# Patient Record
Sex: Male | Born: 1990 | Race: Black or African American | Hispanic: No | Marital: Single | Smoking: Current some day smoker
Health system: Southern US, Community
[De-identification: ages and names within clinical notes are randomized; demographics above are authoritative.]

## PROBLEM LIST (undated history)

## (undated) DIAGNOSIS — D571 Sickle-cell disease without crisis: Secondary | ICD-10-CM

---

## 2009-10-06 ENCOUNTER — Emergency Department (HOSPITAL_COMMUNITY): Admission: EM | Admit: 2009-10-06 | Discharge: 2009-10-06 | Payer: Self-pay | Admitting: Emergency Medicine

## 2016-08-09 ENCOUNTER — Emergency Department (HOSPITAL_COMMUNITY): Payer: Self-pay

## 2016-08-09 ENCOUNTER — Encounter (HOSPITAL_COMMUNITY): Payer: Self-pay

## 2016-08-09 ENCOUNTER — Emergency Department (HOSPITAL_COMMUNITY)
Admission: EM | Admit: 2016-08-09 | Discharge: 2016-08-09 | Disposition: A | Discharge status: Home / Self Care | Payer: Self-pay | Attending: Emergency Medicine | Admitting: Emergency Medicine

## 2016-08-09 DIAGNOSIS — Z7982 Long term (current) use of aspirin: Secondary | ICD-10-CM | POA: Insufficient documentation

## 2016-08-09 DIAGNOSIS — D5700 Hb-SS disease with crisis, unspecified: Secondary | ICD-10-CM

## 2016-08-09 DIAGNOSIS — R079 Chest pain, unspecified: Secondary | ICD-10-CM

## 2016-08-09 DIAGNOSIS — F17200 Nicotine dependence, unspecified, uncomplicated: Secondary | ICD-10-CM | POA: Insufficient documentation

## 2016-08-09 DIAGNOSIS — R091 Pleurisy: Secondary | ICD-10-CM

## 2016-08-09 HISTORY — DX: Sickle-cell disease without crisis: D57.1

## 2016-08-09 LAB — BASIC METABOLIC PANEL
ANION GAP: 6 (ref 5–15)
BUN: 12 mg/dL (ref 6–20)
CALCIUM: 9.3 mg/dL (ref 8.9–10.3)
CO2: 26 mmol/L (ref 22–32)
Chloride: 109 mmol/L (ref 101–111)
Creatinine, Ser: 1.04 mg/dL (ref 0.61–1.24)
Glucose, Bld: 93 mg/dL (ref 65–99)
Potassium: 3.8 mmol/L (ref 3.5–5.1)
Sodium: 141 mmol/L (ref 135–145)

## 2016-08-09 LAB — CBC
HCT: 35.5 % — ABNORMAL LOW (ref 39.0–52.0)
HEMOGLOBIN: 12.9 g/dL — AB (ref 13.0–17.0)
MCH: 25.1 pg — ABNORMAL LOW (ref 26.0–34.0)
MCHC: 36.3 g/dL — ABNORMAL HIGH (ref 30.0–36.0)
MCV: 69.2 fL — ABNORMAL LOW (ref 78.0–100.0)
Platelets: 74 10*3/uL — ABNORMAL LOW (ref 150–400)
RBC: 5.13 MIL/uL (ref 4.22–5.81)
RDW: 16.7 % — ABNORMAL HIGH (ref 11.5–15.5)
WBC: 7.8 10*3/uL (ref 4.0–10.5)

## 2016-08-09 LAB — I-STAT TROPONIN, ED: TROPONIN I, POC: 0 ng/mL (ref 0.00–0.08)

## 2016-08-09 LAB — SEDIMENTATION RATE: Sed Rate: 1 mm/hr (ref 0–16)

## 2016-08-09 LAB — RETICULOCYTES
RBC.: 4.82 MIL/uL (ref 4.22–5.81)
RETIC CT PCT: 1.7 % (ref 0.4–3.1)
Retic Count, Absolute: 81.9 10*3/uL (ref 19.0–186.0)

## 2016-08-09 LAB — D-DIMER, QUANTITATIVE: D-Dimer, Quant: 0.55 ug/mL-FEU — ABNORMAL HIGH (ref 0.00–0.50)

## 2016-08-09 MED ORDER — IOPAMIDOL (ISOVUE-370) INJECTION 76%
100.0000 mL | Freq: Once | INTRAVENOUS | Status: AC | PRN
  Administered 2016-08-09: 100 mL via INTRAVENOUS

## 2016-08-09 MED ORDER — MORPHINE SULFATE (PF) 4 MG/ML IV SOLN
4.0000 mg | Freq: Once | INTRAVENOUS | Status: AC
  Administered 2016-08-09: 4 mg via INTRAVENOUS
  Filled 2016-08-09: qty 1

## 2016-08-09 MED ORDER — KETOROLAC TROMETHAMINE 30 MG/ML IJ SOLN
30.0000 mg | Freq: Once | INTRAMUSCULAR | Status: AC
  Administered 2016-08-09: 30 mg via INTRAVENOUS
  Filled 2016-08-09: qty 1

## 2016-08-09 MED ORDER — HYDROCODONE-ACETAMINOPHEN 5-325 MG PO TABS: 2.0000 | ORAL_TABLET | ORAL | 0 refills | Status: DC | PRN

## 2016-08-09 MED ORDER — ONDANSETRON HCL 4 MG/2ML IJ SOLN
4.0000 mg | Freq: Once | INTRAMUSCULAR | Status: AC
  Administered 2016-08-09: 4 mg via INTRAVENOUS
  Filled 2016-08-09: qty 2

## 2016-08-09 MED ORDER — INDOMETHACIN 25 MG PO CAPS: 25.0000 mg | ORAL_CAPSULE | Freq: Three times a day (TID) | ORAL | 0 refills | Status: DC | PRN

## 2016-08-09 NOTE — ED Triage Notes (Signed)
Pt has sickle cell.  Normal pain is in extremities.  Last night and this morning he has been having chest pain.  Denies physical exertion or cough/congestion

## 2016-08-09 NOTE — ED Provider Notes (Signed)
[facility]ER[facility] DEPT Provider Note   CSN: [id number] Arrival date & time: 08/09/16  8657     History   Chief Complaint Chief Complaint  Patient presents with  . Chest Pain    HPI Justin Solis is a 25 y.o. male. Reason for evaluation of leg pain, and chest pain.  Has a history of sickle cell anemia. He is normally under excellent control. He states his last flare was many years ago. He does not take daily medications for please never had chest pain is a symptom of his nasal occlusive crisis but he has had extremity pain. He at some bilateral arm and bilateral leg pain over the last 2 days. He states that he has some pain in his chest throughout the day today. He rates it an 8/10 and it is sharp it is painful to move and painful to breathe. He has not had fever cough or shortness of breath. No history of DVT or PE. No history of ACS.  HPI  Past Medical History:  Diagnosis Date  . Sickle cell anemia (HCC)     There are no active problems to display for this patient.   History reviewed. No pertinent surgical history.     Home Medications    Prior to Admission medications   Medication Sig Start Date End Date Taking? Authorizing Provider  aspirin-acetaminophen-caffeine (EXCEDRIN MIGRAINE) [phone] MG tablet Take 1 tablet by mouth every 6 (six) hours as needed for headache.   Yes Historical Provider, MD  HYDROcodone-acetaminophen (NORCO/VICODIN) 5-325 MG tablet Take 2 tablets by mouth every 4 (four) hours as needed. 08/09/16   Rolland Porter, MD  indomethacin (INDOCIN) 25 MG capsule Take 1 capsule (25 mg total) by mouth 3 (three) times daily as needed. 08/09/16   Rolland Porter, MD    Family History History reviewed. No pertinent family history.  Social History Social History  Substance Use Topics  . Smoking status: Current Some Day Smoker  . Smokeless tobacco: Never Used  . Alcohol use No     Allergies   Review of patient's allergies indicates no known  allergies.   Review of Systems Review of Systems  Constitutional: Negative for appetite change, chills, diaphoresis, fatigue and fever.  HENT: Negative for mouth sores, sore throat and trouble swallowing.   Eyes: Negative for visual disturbance.  Respiratory: Negative for cough, chest tightness, shortness of breath and wheezing.   Cardiovascular: Positive for chest pain.  Gastrointestinal: Negative for abdominal distention, abdominal pain, diarrhea, nausea and vomiting.  Endocrine: Negative for polydipsia, polyphagia and polyuria.  Genitourinary: Negative for dysuria, frequency and hematuria.  Musculoskeletal: Positive for myalgias. Negative for gait problem.  Skin: Negative for color change, pallor and rash.  Neurological: Negative for dizziness, syncope, light-headedness and headaches.  Hematological: Does not bruise/bleed easily.  Psychiatric/Behavioral: Negative for behavioral problems and confusion.     Physical Exam Updated Vital Signs BP 117/76 (BP Location: Left Arm)   Pulse 66   Temp 97.8 F (36.6 C) (Oral)   Resp 15   Ht 6\' 2"  (1.88 m)   Wt 170 lb (77.1 kg)   SpO2 98%   BMI 21.83 kg/m   Physical Exam  Constitutional: He is oriented to person, place, and time. He appears well-developed and well-nourished. No distress.  HENT:  Head: Normocephalic.  Eyes: Conjunctivae are normal. Pupils are equal, round, and reactive to light. No scleral icterus.  Neck: Normal range of motion. Neck supple. No thyromegaly present.  Cardiovascular: Normal rate and regular rhythm.  Exam reveals no gallop and no friction rub.   No murmur heard. Is not tachycardic. No pleural or pericardial friction rub.  Pulmonary/Chest: Effort normal and breath sounds normal. No respiratory distress. He has no wheezes. He has no rales.  Holds his hand across his sternum and just to the left of sternum. Nontender to palpate. Clear bilateral breath sounds.  Abdominal: Soft. Bowel sounds are normal. He  exhibits no distension. There is no tenderness. There is no rebound.  Musculoskeletal: Normal range of motion.  Complains of pain in bilateral thighs. Normal examination. No edema or asymmetry in circumference of the lower extremities. No cording.  Neurological: He is alert and oriented to person, place, and time.  Skin: Skin is warm and dry. No rash noted.  Psychiatric: He has a normal mood and affect. His behavior is normal.     ED Treatments / Results  Labs (all labs ordered are listed, but only abnormal results are displayed) Labs Reviewed  CBC - Abnormal; Notable for the following:       Result Value   Hemoglobin 12.9 (*)    HCT 35.5 (*)    MCV 69.2 (*)    MCH 25.1 (*)    MCHC 36.3 (*)    RDW 16.7 (*)    Platelets 74 (*)    All other components within normal limits  D-DIMER, QUANTITATIVE (NOT [facility]) - Abnormal; Notable for the following:    D-Dimer, Quant 0.55 (*)    All other components within normal limits  BASIC METABOLIC PANEL  SEDIMENTATION RATE  RETICULOCYTES  I-STAT TROPOININ, ED    EKG  EKG Interpretation  Date/Time:  Tuesday August 09 2016 08:50:28 EDT Ventricular Rate:  73 PR Interval:    QRS Duration: 98 QT Interval:  354 QTC Calculation: 390 R Axis:   88 Text Interpretation:  Sinus rhythm Borderline prolonged PR interval RSR' in V1 or V2, right VCD or RVH ST changes, ?pericarditis No comparison EKGs Reconfirmed by [facility]  MD, Yaileen Hofferber ([id number]) on 08/09/2016 9:58:05 AM       Radiology Dg Chest 2 View  Result Date: 08/09/2016 CLINICAL DATA:  Chest pain and shortness of breath. Sickle cell crisis. EXAM: CHEST  2 VIEW COMPARISON:  None. FINDINGS: The lungs are clear. The heart size and pulmonary vascularity are normal. No adenopathy. No bone lesions evident. IMPRESSION: No abnormality noted radiographically. Electronically Signed   By: Bretta BangWilliam  Woodruff III M.D.   On: 08/09/2016 09:19   Ct Angio Chest Pe W Or Wo Contrast  Result Date:  08/09/2016 CLINICAL DATA:  25 year old with sickle cell disease presenting with acute onset of chest pain which began last night. Elevated D-dimer. EXAM: CT ANGIOGRAPHY CHEST WITH CONTRAST TECHNIQUE: Multidetector CT imaging of the chest was performed using the standard protocol during bolus administration of intravenous contrast. Multiplanar CT image reconstructions and MIPs were obtained to evaluate the vascular anatomy. CONTRAST:  100 mL Isovue 370 IV. COMPARISON:  None. FINDINGS: Technical quality:  Excellent. Pulmonary emboli:  Absent. Cardiovascular: Upper normal heart size. No visible coronary atherosclerosis. No visible atherosclerosis involving thoracic or upper abdominal aorta or their visualized branches. No pericardial effusion. Mediastinum/Nodes: No pathologically enlarged mediastinal, hilar or axillary lymph nodes. No mediastinal masses. Normal-appearing esophagus. Residual thymic tissue in the anterior superior mediastinum. Visualized thyroid gland unremarkable. Lungs/Pleura: Pulmonary parenchyma clear without localized airspace consolidation, interstitial disease, or parenchymal nodules or masses. Central airways patent without significant bronchial wall thickening. No pleural effusions. No pleural plaques or masses. Upper  Abdomen: Unremarkable for the very early arterial phase of enhancement. Musculoskeletal: Regional skeleton intact without acute or significant osseous abnormality. Review of the MIP images confirms the above findings. IMPRESSION: 1. No evidence of pulmonary embolism. 2.  No acute cardiopulmonary disease. Electronically Signed   By: Hulan Saas M.D.   On: 08/09/2016 12:44    Procedures Procedures (including critical care time)  Medications Ordered in ED Medications  ketorolac (TORADOL) 30 MG/ML injection 30 mg (30 mg Intravenous Given 08/09/16 1031)  iopamidol (ISOVUE-370) 76 % injection 100 mL (100 mLs Intravenous Contrast Given 08/09/16 1225)  ondansetron (ZOFRAN)  injection 4 mg (4 mg Intravenous Given 08/09/16 1301)  morphine 4 MG/ML injection 4 mg (4 mg Intravenous Given 08/09/16 1301)     Initial Impression / Assessment and Plan / ED Course  I have reviewed the triage vital signs and the nursing notes.  Pertinent labs & imaging results that were available during my care of the patient were reviewed by me and considered in my medical decision making (see chart for details).  Clinical Course    EKG shows early repolarization. No frank elevations or PR depression to suggest pericarditis. He is not tachycardic. However, was considering his leg pain and pleuritic chest pain d-dimer was ordered which is high. CT and shows normal chest. No fluid around the heart to suggest pericarditis. No pleural effusion. No infiltrates. No PE. He was given Toradol his symptoms in his chest are markedly improved. His given some IV morphine and the pain in his legs has improved. I think is appropriate for discharge home remainder of his labs are reassuring. He will follow-up with primary care. Given prescription for indomethacin for his pleurisy. Vicodin for his sickle cell flare.  Final Clinical Impressions(s) / ED Diagnoses   Final diagnoses:  Chest pain, unspecified chest pain type  Pleurisy  Sickle cell anemia with crisis [facility])    New Prescriptions Discharge Medication List as of 08/09/2016  1:35 PM    START taking these medications   Details  HYDROcodone-acetaminophen (NORCO/VICODIN) 5-325 MG tablet Take 2 tablets by mouth every 4 (four) hours as needed., Starting Tue 08/09/2016, Print    indomethacin (INDOCIN) 25 MG capsule Take 1 capsule (25 mg total) by mouth 3 (three) times daily as needed., Starting Tue 08/09/2016, Print         Rolland Porter, MD 08/09/16 [phone]

## 2016-08-09 NOTE — Discharge Instructions (Signed)
[place] for leg pain.  Avoid vigorous physical activity and exercise until your chest pain has resolved.

## 2016-08-24 DIAGNOSIS — D5720 Sickle-cell/Hb-C disease without crisis: Secondary | ICD-10-CM | POA: Insufficient documentation

## 2016-08-24 DIAGNOSIS — F17200 Nicotine dependence, unspecified, uncomplicated: Secondary | ICD-10-CM | POA: Insufficient documentation

## 2016-08-25 ENCOUNTER — Encounter (HOSPITAL_COMMUNITY): Payer: Self-pay | Admitting: Emergency Medicine

## 2016-08-25 ENCOUNTER — Emergency Department (HOSPITAL_COMMUNITY)
Admission: EM | Admit: 2016-08-25 | Discharge: 2016-08-25 | Disposition: A | Discharge status: Home / Self Care | Payer: Self-pay | Attending: Emergency Medicine | Admitting: Emergency Medicine

## 2016-08-25 DIAGNOSIS — D57219 Sickle-cell/Hb-C disease with crisis, unspecified: Secondary | ICD-10-CM

## 2016-08-25 LAB — COMPREHENSIVE METABOLIC PANEL
ALBUMIN: 4.9 g/dL (ref 3.5–5.0)
ALK PHOS: 53 U/L (ref 38–126)
ALT: 31 U/L (ref 17–63)
ANION GAP: 7 (ref 5–15)
AST: 23 U/L (ref 15–41)
BUN: 15 mg/dL (ref 6–20)
CALCIUM: 9.6 mg/dL (ref 8.9–10.3)
CO2: 25 mmol/L (ref 22–32)
Chloride: 108 mmol/L (ref 101–111)
Creatinine, Ser: 1.02 mg/dL (ref 0.61–1.24)
GFR calc non Af Amer: >60 mL/min (ref >60)
GLUCOSE: 124 mg/dL — AB (ref 65–99)
POTASSIUM: 3.7 mmol/L (ref 3.5–5.1)
SODIUM: 140 mmol/L (ref 135–145)
Total Bilirubin: 1 mg/dL (ref 0.3–1.2)
Total Protein: 7.7 g/dL (ref 6.5–8.1)

## 2016-08-25 LAB — CBC WITH DIFFERENTIAL/PLATELET
BASOS ABS: 0 10*3/uL (ref 0.0–0.1)
BASOS PCT: 0 %
EOS ABS: 0.1 10*3/uL (ref 0.0–0.7)
Eosinophils Relative: 1 %
HCT: 34 % — ABNORMAL LOW (ref 39.0–52.0)
HEMOGLOBIN: 12.4 g/dL — AB (ref 13.0–17.0)
LYMPHS PCT: 21 %
Lymphs Abs: 1.7 10*3/uL (ref 0.7–4.0)
MCH: 25 pg — ABNORMAL LOW (ref 26.0–34.0)
MCHC: 36.5 g/dL — ABNORMAL HIGH (ref 30.0–36.0)
MCV: 68.5 fL — ABNORMAL LOW (ref 78.0–100.0)
MONOS PCT: 6 %
Monocytes Absolute: 0.5 10*3/uL (ref 0.1–1.0)
NEUTROS ABS: 5.6 10*3/uL (ref 1.7–7.7)
NEUTROS PCT: 72 %
PLATELETS: 83 10*3/uL — AB (ref 150–400)
RBC: 4.96 MIL/uL (ref 4.22–5.81)
RDW: 16.9 % — ABNORMAL HIGH (ref 11.5–15.5)
WBC: 7.9 10*3/uL (ref 4.0–10.5)

## 2016-08-25 LAB — RETICULOCYTES
RBC.: 5.01 MIL/uL (ref 4.22–5.81)
Retic Count, Absolute: 120.2 10*3/uL (ref 19.0–186.0)
Retic Ct Pct: 2.4 % (ref 0.4–3.1)

## 2016-08-25 MED ORDER — HYDROMORPHONE HCL 1 MG/ML IJ SOLN: 1.0000 mg | Freq: Once | INTRAMUSCULAR | Status: DC

## 2016-08-25 MED ORDER — MORPHINE SULFATE (PF) 4 MG/ML IV SOLN
4.0000 mg | Freq: Once | INTRAVENOUS | Status: AC
  Administered 2016-08-25: 4 mg via INTRAVENOUS
  Filled 2016-08-25: qty 1

## 2016-08-25 MED ORDER — SODIUM CHLORIDE 0.9 % IV BOLUS (SEPSIS)
1000.0000 mL | Freq: Once | INTRAVENOUS | Status: AC
  Administered 2016-08-25: 1000 mL via INTRAVENOUS

## 2016-08-25 MED ORDER — INDOMETHACIN 25 MG PO CAPS: 25.0000 mg | ORAL_CAPSULE | Freq: Three times a day (TID) | ORAL | 0 refills | Status: DC | PRN

## 2016-08-25 MED ORDER — KETOROLAC TROMETHAMINE 30 MG/ML IJ SOLN
30.0000 mg | Freq: Once | INTRAMUSCULAR | Status: AC
  Administered 2016-08-25: 30 mg via INTRAVENOUS
  Filled 2016-08-25: qty 1

## 2016-08-25 MED ORDER — ONDANSETRON HCL 4 MG/2ML IJ SOLN
4.0000 mg | Freq: Once | INTRAMUSCULAR | Status: AC
  Administered 2016-08-25: 4 mg via INTRAVENOUS
  Filled 2016-08-25: qty 2

## 2016-08-25 NOTE — ED Provider Notes (Signed)
[facility] DEPT Provider Note   CSN: [id number] Arrival date & time: 08/24/16  2354 By signing my name below, I, Justin Solis, attest that this documentation has been prepared under the direction and in the presence of non-physician practitioner, Antony MaduraKelly Chrystie Hagwood, PA-C Electronically Signed: Levon HedgerElizabeth Solis, Scribe. 08/25/2016. 1:35 AM.    History   Chief Complaint Chief Complaint  Patient presents with  . Sickle Cell Pain Crisis   HPI Justin Solis is a 25 y.o. male with hx of sickle cell anemia ([place] trait) who presents to the Emergency Department complaining of constant, severe left arm pain which began at 6 this am. He describes the pain as aching, sharp, and stabbing. He has taken Excedrin with no relief. He had not had a crisis in seven years until about 2 weeks ago. Pt notes associated vomiting 2x PTA. Pt denies fever, SOB, and CP.    The history is provided by the patient. No language interpreter was used.    Past Medical History:  Diagnosis Date  . Sickle cell anemia (HCC)    There are no active problems to display for this patient.  History reviewed. No pertinent surgical history.   Home Medications    Prior to Admission medications   Medication Sig Start Date End Date Taking? Authorizing Provider  aspirin-acetaminophen-caffeine (EXCEDRIN MIGRAINE) [phone] MG tablet Take 1 tablet by mouth every 6 (six) hours as needed for headache.   Yes Historical Provider, MD  HYDROcodone-acetaminophen (NORCO/VICODIN) 5-325 MG tablet Take 2 tablets by mouth every 4 (four) hours as needed. Patient not taking: Reported on 08/25/2016 08/09/16   Rolland PorterMark James, MD  indomethacin (INDOCIN) 25 MG capsule Take 1 capsule (25 mg total) by mouth 3 (three) times daily as needed. 08/25/16   Antony MaduraKelly Kamarie Palma, PA-C    Family History History reviewed. No pertinent family history.  Social History Social History  Substance Use Topics  . Smoking status: Current Some Day Smoker  . Smokeless  tobacco: Never Used  . Alcohol use No     Allergies   Review of patient's allergies indicates no known allergies.   Review of Systems Review of Systems Ten systems reviewed and are negative for acute change, except as noted in the HPI.    Physical Exam Updated Vital Signs BP 125/85 (BP Location: Left Arm)   Pulse 77   Temp 98.2 F (36.8 C) (Oral)   Resp 18   Ht 6\' 2"  (1.88 m)   Wt 77.1 kg   SpO2 100%   BMI 21.83 kg/m   Physical Exam  Constitutional: He is oriented to person, place, and time. He appears well-developed and well-nourished. No distress.  Nontoxic appearing in no distress  HENT:  Head: Normocephalic and atraumatic.  Eyes: Conjunctivae and EOM are normal. No scleral icterus.  Neck: Normal range of motion.  Cardiovascular: Normal rate, regular rhythm and intact distal pulses.   Distal radial pulse 2+ in the left upper extremity.  Pulmonary/Chest: Effort normal. No respiratory distress. He has no wheezes.  Respirations even and unlabored  Musculoskeletal: Normal range of motion.  Normal range of motion of the left upper extremity. Compartments soft. No swelling or pitting edema.  Neurological: He is alert and oriented to person, place, and time. He exhibits normal muscle tone. Coordination normal.  Skin: Skin is warm and dry. No rash noted. He is not diaphoretic. No erythema. No pallor.  Psychiatric: He has a normal mood and affect. His behavior is normal.  Nursing note and vitals reviewed.  ED Treatments / Results  DIAGNOSTIC STUDIES:  Oxygen Saturation is 98% on RA, normal by my interpretation.    COORDINATION OF CARE:  1:30 AM  Will order reticulocytes, CBC, and CMP. Discussed treatment plan which includes dilaudid, Toradol and Zofran  with pt at bedside and pt agreed to plan.    Labs (all labs ordered are listed, but only abnormal results are displayed) Labs Reviewed  COMPREHENSIVE METABOLIC PANEL - Abnormal; Notable for the following:        Result Value   Glucose, Bld 124 (*)    All other components within normal limits  CBC WITH DIFFERENTIAL/PLATELET - Abnormal; Notable for the following:    Hemoglobin 12.4 (*)    HCT 34.0 (*)    MCV 68.5 (*)    MCH 25.0 (*)    MCHC 36.5 (*)    RDW 16.9 (*)    Platelets 83 (*)    All other components within normal limits  RETICULOCYTES    EKG  EKG Interpretation None       Radiology No results found.  Procedures Procedures (including critical care time)  Medications Ordered in ED Medications  morphine 4 MG/ML injection 4 mg (not administered)  ketorolac (TORADOL) 30 MG/ML injection 30 mg (30 mg Intravenous Given 08/25/16 0152)  sodium chloride 0.9 % bolus 1,000 mL (0 mLs Intravenous Stopped 08/25/16 0247)  ondansetron (ZOFRAN) injection 4 mg (4 mg Intravenous Given 08/25/16 0152)  morphine 4 MG/ML injection 4 mg (4 mg Intravenous Given 08/25/16 0152)  morphine 4 MG/ML injection 4 mg (4 mg Intravenous Given 08/25/16 0300)     Initial Impression / Assessment and Plan / ED Course  I have reviewed the triage vital signs and the nursing notes.  Pertinent labs & imaging results that were available during my care of the patient were reviewed by me and considered in my medical decision making (see chart for details).  Clinical Course    25 year old male with a history of sickle cell  anemia presents to the emergency department for pain in his left upper extremity. He is neurovascularly intact and has preserved range of motion. No complaints of chest pain or shortness of breath. Patient is afebrile and denies recent fevers. No history of trauma or injury. He states that his pain feels similar to past sickle cell pain flares. He was evaluated at the end of September for similar symptoms. He reports recently relocating to the area.  Symptoms managed with Toradol and morphine. Patient tolerated this well and reports improvement in his symptoms on repeat evaluation. Laboratory  workup is at baseline. Vital signs are stable. I did not believe further inpatient management is indicated for pain control. Patient to be discharged with instructions for supportive care. Financial assistance guide given for primary care follow-up. Return precautions discussed and provided. Patient discharged in stable condition with no unaddressed concerns.   Final Clinical Impressions(s) / ED Diagnoses   Final diagnoses:  Hemoglobin s/c disease, with unspecified crisis [facility])    New Prescriptions Current Discharge Medication List      I personally performed the services described in this documentation, which was scribed in my presence. The recorded information has been reviewed and is accurate.      Antony Madura, PA-C 08/25/16 0423    Shon Baton, MD 08/25/16 [phone]

## 2016-08-25 NOTE — ED Triage Notes (Addendum)
Pt states that he has had sickle cell pain in his arms since this morning. Does not have RX for pain meds at home. Also states that he vomited before arrival. Alert and oriented. Denies SOB/CP.

## 2016-08-26 ENCOUNTER — Encounter (HOSPITAL_COMMUNITY): Payer: Self-pay | Admitting: Emergency Medicine

## 2016-08-26 ENCOUNTER — Emergency Department (HOSPITAL_COMMUNITY)
Admission: EM | Admit: 2016-08-26 | Discharge: 2016-08-26 | Disposition: A | Discharge status: Home / Self Care | Payer: Self-pay | Attending: Emergency Medicine | Admitting: Emergency Medicine

## 2016-08-26 DIAGNOSIS — D5700 Hb-SS disease with crisis, unspecified: Secondary | ICD-10-CM | POA: Insufficient documentation

## 2016-08-26 DIAGNOSIS — F17200 Nicotine dependence, unspecified, uncomplicated: Secondary | ICD-10-CM | POA: Insufficient documentation

## 2016-08-26 LAB — CBC WITH DIFFERENTIAL/PLATELET
BASOS PCT: 0 %
Basophils Absolute: 0 10*3/uL (ref 0.0–0.1)
EOS ABS: 0.1 10*3/uL (ref 0.0–0.7)
Eosinophils Relative: 2 %
HCT: 31 % — ABNORMAL LOW (ref 39.0–52.0)
Hemoglobin: 11.7 g/dL — ABNORMAL LOW (ref 13.0–17.0)
Lymphocytes Relative: 19 %
Lymphs Abs: 1.3 10*3/uL (ref 0.7–4.0)
MCH: 25.2 pg — AB (ref 26.0–34.0)
MCHC: 37.7 g/dL — ABNORMAL HIGH (ref 30.0–36.0)
MCV: 66.8 fL — AB (ref 78.0–100.0)
MONO ABS: 0.5 10*3/uL (ref 0.1–1.0)
Monocytes Relative: 7 %
NEUTROS ABS: 4.9 10*3/uL (ref 1.7–7.7)
NEUTROS PCT: 72 %
PLATELETS: 87 10*3/uL — AB (ref 150–400)
RBC: 4.64 MIL/uL (ref 4.22–5.81)
RDW: 16.9 % — AB (ref 11.5–15.5)
WBC: 6.8 10*3/uL (ref 4.0–10.5)

## 2016-08-26 LAB — COMPREHENSIVE METABOLIC PANEL
ALK PHOS: 60 U/L (ref 38–126)
ALT: 21 U/L (ref 17–63)
AST: 19 U/L (ref 15–41)
Albumin: 4.6 g/dL (ref 3.5–5.0)
Anion gap: 6 (ref 5–15)
BILIRUBIN TOTAL: 0.9 mg/dL (ref 0.3–1.2)
BUN: 14 mg/dL (ref 6–20)
CALCIUM: 9.3 mg/dL (ref 8.9–10.3)
CO2: 28 mmol/L (ref 22–32)
CREATININE: 1 mg/dL (ref 0.61–1.24)
Chloride: 108 mmol/L (ref 101–111)
GFR calc non Af Amer: >60 mL/min (ref >60)
Glucose, Bld: 86 mg/dL (ref 65–99)
Potassium: 3.9 mmol/L (ref 3.5–5.1)
SODIUM: 142 mmol/L (ref 135–145)
TOTAL PROTEIN: 7 g/dL (ref 6.5–8.1)

## 2016-08-26 LAB — RETICULOCYTES
RBC.: 4.7 MIL/uL (ref 4.22–5.81)
Retic Count, Absolute: 98.7 10*3/uL (ref 19.0–186.0)
Retic Ct Pct: 2.1 % (ref 0.4–3.1)

## 2016-08-26 MED ORDER — DIPHENHYDRAMINE HCL 50 MG/ML IJ SOLN
25.0000 mg | Freq: Once | INTRAMUSCULAR | Status: AC
  Administered 2016-08-26: 25 mg via INTRAVENOUS
  Filled 2016-08-26: qty 1

## 2016-08-26 MED ORDER — DEXTROSE-NACL 5-0.45 % IV SOLN
INTRAVENOUS | Status: DC
  Administered 2016-08-26: 22:00:00 via INTRAVENOUS

## 2016-08-26 MED ORDER — OXYCODONE-ACETAMINOPHEN 5-325 MG PO TABS: 1.0000 | ORAL_TABLET | ORAL | 0 refills | Status: DC | PRN

## 2016-08-26 MED ORDER — HYDROMORPHONE HCL 2 MG/ML IJ SOLN
0.0250 mg/kg | INTRAMUSCULAR | Status: AC
  Filled 2016-08-26: qty 1

## 2016-08-26 MED ORDER — ONDANSETRON HCL 4 MG/2ML IJ SOLN: 4.0000 mg | INTRAMUSCULAR | Status: DC | PRN

## 2016-08-26 MED ORDER — KETOROLAC TROMETHAMINE 30 MG/ML IJ SOLN
30.0000 mg | INTRAMUSCULAR | Status: AC
  Administered 2016-08-26: 30 mg via INTRAVENOUS
  Filled 2016-08-26: qty 1

## 2016-08-26 MED ORDER — HYDROMORPHONE HCL 2 MG/ML IJ SOLN
0.0250 mg/kg | INTRAMUSCULAR | Status: AC
  Administered 2016-08-26: 2 mg via INTRAVENOUS
  Filled 2016-08-26: qty 1

## 2016-08-26 NOTE — ED Triage Notes (Signed)
Per pt, states left arm pain due to sickle cell crisis-states he was here for same symptoms a couple of days ago-has not followed up with PCP

## 2016-08-26 NOTE — ED Provider Notes (Signed)
[facility] Provider Note   CSN: [id number] Arrival date & time: 08/26/16  1629     History   Chief Complaint Chief Complaint  Patient presents with  . Sickle Cell Pain Crisis    HPI [place] JIMEL MYLER is a 25 y.o. male.  Pt has a hx of sickle cell disease, but has been well for the past 7 years until the last few weeks.  He was here on 9/26 and yesterday for his sickle cell disease.  He does not have a sickle cell doctor, nor does he take regular pain meds for his sickle cell.  He has pain in his left arm and his left leg which is usual for his pain.      Past Medical History:  Diagnosis Date  . Sickle cell anemia (HCC)     There are no active problems to display for this patient.   History reviewed. No pertinent surgical history.     Home Medications    Prior to Admission medications   Medication Sig Start Date End Date Taking? Authorizing Provider  indomethacin (INDOCIN) 25 MG capsule Take 1 capsule (25 mg total) by mouth 3 (three) times daily as needed. Patient taking differently: Take 25 mg by mouth 3 (three) times daily as needed for mild pain.  08/25/16  Yes Antony Madura, PA-C  HYDROcodone-acetaminophen (NORCO/VICODIN) 5-325 MG tablet Take 2 tablets by mouth every 4 (four) hours as needed. Patient not taking: Reported on 08/25/2016 08/09/16   Rolland Porter, MD  oxyCODONE-acetaminophen (PERCOCET/ROXICET) 5-325 MG tablet Take 1 tablet by mouth every 4 (four) hours as needed for severe pain. 08/26/16   Jacalyn Lefevre, MD    Family History No family history on file.  Social History Social History  Substance Use Topics  . Smoking status: Current Some Day Smoker  . Smokeless tobacco: Never Used  . Alcohol use No     Allergies   Review of patient's allergies indicates no known allergies.   Review of Systems Review of Systems  Musculoskeletal:       Arm and leg pain  All other systems reviewed and are negative.    Physical Exam Updated Vital  Signs BP 132/99 (BP Location: Right Arm)   Pulse 82   Temp 98.2 F (36.8 C) (Oral)   Resp 20   Ht 6\' 2"  (1.88 m)   Wt 173 lb 15.1 oz (78.9 kg)   SpO2 100%   BMI 22.33 kg/m   Physical Exam  Constitutional: He is oriented to person, place, and time. He appears well-developed and well-nourished.  HENT:  Head: Normocephalic and atraumatic.  Right Ear: External ear normal.  Left Ear: External ear normal.  Nose: Nose normal.  Mouth/Throat: Oropharynx is clear and moist.  Eyes: EOM are normal. Pupils are equal, round, and reactive to light.  Neck: Normal range of motion. Neck supple.  Cardiovascular: Normal rate, regular rhythm, normal heart sounds and intact distal pulses.   Pulmonary/Chest: Effort normal and breath sounds normal.  Abdominal: Soft. Bowel sounds are normal.  Musculoskeletal: Normal range of motion.  Neurological: He is alert and oriented to person, place, and time.  Skin: Skin is warm.  Psychiatric: He has a normal mood and affect. His behavior is normal. Judgment and thought content normal.  Nursing note and vitals reviewed.    ED Treatments / Results  Labs (all labs ordered are listed, but only abnormal results are displayed) Labs Reviewed  CBC WITH DIFFERENTIAL/PLATELET - Abnormal; Notable for the following:  Result Value   Hemoglobin 11.7 (*)    HCT 31.0 (*)    MCV 66.8 (*)    MCH 25.2 (*)    MCHC 37.7 (*)    RDW 16.9 (*)    Platelets 87 (*)    All other components within normal limits  COMPREHENSIVE METABOLIC PANEL  RETICULOCYTES    EKG  EKG Interpretation None       Radiology No results found.  Procedures Procedures (including critical care time)  Medications Ordered in ED Medications  dextrose 5 %-0.45 % sodium chloride infusion ( Intravenous New Bag/Given 08/26/16 2131)  ondansetron (ZOFRAN) injection 4 mg (not administered)  ketorolac (TORADOL) 30 MG/ML injection 30 mg (30 mg Intravenous Given 08/26/16 2131)  HYDROmorphone  (DILAUDID) injection 2 mg (2 mg Intravenous Given 08/26/16 2132)    Or  HYDROmorphone (DILAUDID) injection 2 mg ( Subcutaneous See Alternative 08/26/16 2132)  diphenhydrAMINE (BENADRYL) injection 25 mg (25 mg Intravenous Given 08/26/16 2132)     Initial Impression / Assessment and Plan / ED Course  I have reviewed the triage vital signs and the nursing notes.  Pertinent labs & imaging results that were available during my care of the patient were reviewed by me and considered in my medical decision making (see chart for details).  Clinical Course    Pt is feeling much better.  He wants to go home.  He is given the number to the sickle cell clinic.  He knows to return if worse.  Final Clinical Impressions(s) / ED Diagnoses   Final diagnoses:  Sickle cell pain crisis (HCC)    New Prescriptions New Prescriptions   OXYCODONE-ACETAMINOPHEN (PERCOCET/ROXICET) 5-325 MG TABLET    Take 1 tablet by mouth every 4 (four) hours as needed for severe pain.     Jacalyn LefevreJulie Bentleigh Stankus, MD 08/26/16 2322

## 2016-08-28 ENCOUNTER — Emergency Department (HOSPITAL_COMMUNITY): Payer: Self-pay

## 2016-08-28 ENCOUNTER — Encounter (HOSPITAL_COMMUNITY): Payer: Self-pay

## 2016-08-28 ENCOUNTER — Inpatient Hospital Stay (HOSPITAL_COMMUNITY)
Admission: EM | Admit: 2016-08-28 | Discharge: 2016-09-01 | DRG: 812 | Disposition: A | Discharge status: Home / Self Care | Payer: Self-pay | Attending: Internal Medicine | Admitting: Internal Medicine

## 2016-08-28 DIAGNOSIS — D696 Thrombocytopenia, unspecified: Secondary | ICD-10-CM | POA: Diagnosis present

## 2016-08-28 DIAGNOSIS — R339 Retention of urine, unspecified: Secondary | ICD-10-CM | POA: Diagnosis not present

## 2016-08-28 DIAGNOSIS — D638 Anemia in other chronic diseases classified elsewhere: Secondary | ICD-10-CM | POA: Diagnosis present

## 2016-08-28 DIAGNOSIS — R509 Fever, unspecified: Secondary | ICD-10-CM

## 2016-08-28 DIAGNOSIS — R0902 Hypoxemia: Secondary | ICD-10-CM | POA: Diagnosis not present

## 2016-08-28 DIAGNOSIS — F17200 Nicotine dependence, unspecified, uncomplicated: Secondary | ICD-10-CM | POA: Diagnosis present

## 2016-08-28 DIAGNOSIS — R109 Unspecified abdominal pain: Secondary | ICD-10-CM

## 2016-08-28 DIAGNOSIS — R161 Splenomegaly, not elsewhere classified: Secondary | ICD-10-CM

## 2016-08-28 DIAGNOSIS — R1012 Left upper quadrant pain: Secondary | ICD-10-CM | POA: Diagnosis present

## 2016-08-28 DIAGNOSIS — M25512 Pain in left shoulder: Secondary | ICD-10-CM | POA: Diagnosis present

## 2016-08-28 DIAGNOSIS — R51 Headache: Secondary | ICD-10-CM | POA: Diagnosis not present

## 2016-08-28 DIAGNOSIS — K5900 Constipation, unspecified: Secondary | ICD-10-CM | POA: Diagnosis not present

## 2016-08-28 DIAGNOSIS — Z79899 Other long term (current) drug therapy: Secondary | ICD-10-CM

## 2016-08-28 DIAGNOSIS — R5081 Fever presenting with conditions classified elsewhere: Secondary | ICD-10-CM | POA: Diagnosis present

## 2016-08-28 DIAGNOSIS — D5700 Hb-SS disease with crisis, unspecified: Principal | ICD-10-CM

## 2016-08-28 DIAGNOSIS — Z23 Encounter for immunization: Secondary | ICD-10-CM

## 2016-08-28 LAB — COMPREHENSIVE METABOLIC PANEL
ALBUMIN: 4.6 g/dL (ref 3.5–5.0)
ALK PHOS: 53 U/L (ref 38–126)
ALT: 18 U/L (ref 17–63)
AST: 18 U/L (ref 15–41)
Anion gap: 6 (ref 5–15)
BILIRUBIN TOTAL: 1 mg/dL (ref 0.3–1.2)
BUN: 9 mg/dL (ref 6–20)
CO2: 27 mmol/L (ref 22–32)
CREATININE: 0.84 mg/dL (ref 0.61–1.24)
Calcium: 9.3 mg/dL (ref 8.9–10.3)
Chloride: 106 mmol/L (ref 101–111)
GFR calc Af Amer: >60 mL/min (ref >60)
GLUCOSE: 99 mg/dL (ref 65–99)
POTASSIUM: 3.6 mmol/L (ref 3.5–5.1)
Sodium: 139 mmol/L (ref 135–145)
TOTAL PROTEIN: 7.3 g/dL (ref 6.5–8.1)

## 2016-08-28 LAB — CBC WITH DIFFERENTIAL/PLATELET
BASOS ABS: 0 10*3/uL (ref 0.0–0.1)
Basophils Relative: 0 %
Eosinophils Absolute: 0.1 10*3/uL (ref 0.0–0.7)
Eosinophils Relative: 2 %
HEMATOCRIT: 32.1 % — AB (ref 39.0–52.0)
HEMOGLOBIN: 11.6 g/dL — AB (ref 13.0–17.0)
LYMPHS ABS: 1.2 10*3/uL (ref 0.7–4.0)
Lymphocytes Relative: 17 %
MCH: 24.8 pg — ABNORMAL LOW (ref 26.0–34.0)
MCHC: 36.1 g/dL — ABNORMAL HIGH (ref 30.0–36.0)
MCV: 68.7 fL — ABNORMAL LOW (ref 78.0–100.0)
MONO ABS: 0.5 10*3/uL (ref 0.1–1.0)
MONOS PCT: 7 %
NEUTROS ABS: 5.5 10*3/uL (ref 1.7–7.7)
Neutrophils Relative %: 74 %
PLATELETS: 78 10*3/uL — AB (ref 150–400)
RBC: 4.67 MIL/uL (ref 4.22–5.81)
RDW: 16.7 % — ABNORMAL HIGH (ref 11.5–15.5)
WBC: 7.3 10*3/uL (ref 4.0–10.5)

## 2016-08-28 LAB — RETICULOCYTES
RBC.: 4.71 MIL/uL (ref 4.22–5.81)
RETIC COUNT ABSOLUTE: 75.4 10*3/uL (ref 19.0–186.0)
Retic Ct Pct: 1.6 % (ref 0.4–3.1)

## 2016-08-28 LAB — SEDIMENTATION RATE: SED RATE: 3 mm/h (ref 0–16)

## 2016-08-28 MED ORDER — POLYETHYLENE GLYCOL 3350 17 G PO PACK
17.0000 g | PACK | Freq: Every day | ORAL | Status: DC | PRN
  Administered 2016-08-30 – 2016-08-31 (×2): 17 g via ORAL
  Filled 2016-08-28 (×2): qty 1

## 2016-08-28 MED ORDER — MORPHINE SULFATE ER 15 MG PO TBCR
15.0000 mg | EXTENDED_RELEASE_TABLET | Freq: Two times a day (BID) | ORAL | Status: DC
  Administered 2016-08-28 (×2): 15 mg via ORAL
  Filled 2016-08-28 (×2): qty 1

## 2016-08-28 MED ORDER — DEXTROSE-NACL 5-0.45 % IV SOLN
INTRAVENOUS | Status: DC
  Administered 2016-08-28 – 2016-08-29 (×2): via INTRAVENOUS
  Administered 2016-08-30 (×2): 1000 mL via INTRAVENOUS
  Administered 2016-08-31: 12:00:00 via INTRAVENOUS

## 2016-08-28 MED ORDER — ONDANSETRON HCL 4 MG/2ML IJ SOLN
4.0000 mg | Freq: Four times a day (QID) | INTRAMUSCULAR | Status: DC | PRN
  Administered 2016-08-28: 4 mg via INTRAVENOUS
  Filled 2016-08-28: qty 2

## 2016-08-28 MED ORDER — DIPHENHYDRAMINE HCL 25 MG PO CAPS
25.0000 mg | ORAL_CAPSULE | ORAL | Status: DC | PRN
  Administered 2016-08-28 – 2016-08-31 (×4): 25 mg via ORAL
  Filled 2016-08-28 (×4): qty 1

## 2016-08-28 MED ORDER — ENOXAPARIN SODIUM 40 MG/0.4ML ~~LOC~~ SOLN
40.0000 mg | SUBCUTANEOUS | Status: DC
Start: 2016-08-28 — End: 2016-08-29
  Administered 2016-08-29: 40 mg via SUBCUTANEOUS
  Filled 2016-08-28 (×2): qty 0.4

## 2016-08-28 MED ORDER — KETOROLAC TROMETHAMINE 15 MG/ML IJ SOLN
15.0000 mg | Freq: Once | INTRAMUSCULAR | Status: AC
  Administered 2016-08-28: 15 mg via INTRAVENOUS
  Filled 2016-08-28: qty 1

## 2016-08-28 MED ORDER — HYDROMORPHONE HCL 2 MG/ML IJ SOLN
2.0000 mg | Freq: Once | INTRAMUSCULAR | Status: AC
  Administered 2016-08-28: 2 mg via INTRAVENOUS
  Filled 2016-08-28: qty 1

## 2016-08-28 MED ORDER — SODIUM CHLORIDE 0.9 % IV SOLN
25.0000 mg | INTRAVENOUS | Status: DC | PRN
  Filled 2016-08-28: qty 0.5

## 2016-08-28 MED ORDER — DIPHENHYDRAMINE HCL 50 MG/ML IJ SOLN
25.0000 mg | Freq: Once | INTRAMUSCULAR | Status: AC
  Administered 2016-08-28: 25 mg via INTRAVENOUS
  Filled 2016-08-28: qty 1

## 2016-08-28 MED ORDER — HYDROMORPHONE HCL 2 MG/ML IJ SOLN
2.0000 mg | INTRAMUSCULAR | Status: DC | PRN
Start: 2016-08-28 — End: 2016-08-29
  Administered 2016-08-28 – 2016-08-29 (×6): 2 mg via INTRAVENOUS
  Filled 2016-08-28 (×6): qty 1

## 2016-08-28 MED ORDER — PROMETHAZINE HCL 25 MG/ML IJ SOLN
25.0000 mg | Freq: Three times a day (TID) | INTRAMUSCULAR | Status: DC | PRN
  Administered 2016-08-28 – 2016-08-29 (×2): 25 mg via INTRAVENOUS
  Filled 2016-08-28 (×2): qty 1

## 2016-08-28 MED ORDER — INFLUENZA VAC SPLIT QUAD 0.5 ML IM SUSY
0.5000 mL | PREFILLED_SYRINGE | INTRAMUSCULAR | Status: AC
  Administered 2016-08-30: 0.5 mL via INTRAMUSCULAR
  Filled 2016-08-28 (×2): qty 0.5

## 2016-08-28 MED ORDER — KETOROLAC TROMETHAMINE 30 MG/ML IJ SOLN
30.0000 mg | Freq: Four times a day (QID) | INTRAMUSCULAR | Status: DC
  Administered 2016-08-28 – 2016-09-01 (×15): 30 mg via INTRAVENOUS
  Filled 2016-08-28 (×16): qty 1

## 2016-08-28 MED ORDER — ACETAMINOPHEN 500 MG PO TABS
1000.0000 mg | ORAL_TABLET | Freq: Four times a day (QID) | ORAL | Status: DC | PRN
  Administered 2016-08-28: 1000 mg via ORAL
  Filled 2016-08-28: qty 2

## 2016-08-28 MED ORDER — FOLIC ACID 1 MG PO TABS
1.0000 mg | ORAL_TABLET | Freq: Every day | ORAL | Status: DC
  Administered 2016-08-28 – 2016-09-01 (×5): 1 mg via ORAL
  Filled 2016-08-28 (×5): qty 1

## 2016-08-28 MED ORDER — SENNOSIDES-DOCUSATE SODIUM 8.6-50 MG PO TABS
1.0000 | ORAL_TABLET | Freq: Two times a day (BID) | ORAL | Status: DC
  Administered 2016-08-29 – 2016-09-01 (×7): 1 via ORAL
  Filled 2016-08-28 (×9): qty 1

## 2016-08-28 NOTE — ED Provider Notes (Signed)
[facility]-EMERGENCY DEPT Provider Note   CSN: [id number] Arrival date & time: 08/28/16  0141  By signing my name below, I, Suzan SlickAshley N. Elon SpannerLeger, attest that this documentation has been prepared under the direction and in the presence of Derwood KaplanAnkit Lodie Waheed, MD.  Electronically Signed: Suzan SlickAshley N. Elon SpannerLeger, ED Scribe. 08/28/16. 4:23 AM.    History   Chief Complaint Chief Complaint  Patient presents with  . Sickle Cell Pain Crisis   HPI  HPI Comments: Justin Solis is a 25 y.o. male with a PMHx of sickle cell anemia who presents to the Emergency Department here for a sickle cell crisis this evening. Pt reports constant, unchanged R sided HA x 1 hour. He also reports ongoing, unchanged pain to the L arm x 1 week. In addition, pt states when he woke this morning, he was unable to see through either eye for approximately 1 hour. Family member states when pt experiences sickle cell episodes, discomfort typically presents in his L arm, however, pain to his head and visual changes is new for him. No recent fever, chills, visual changes, numbness, or tingling. Pt has been evaluated in the the Emergency Department multiple times throughout the week for arm pain. Pt denies any long term improvement and is unable to control his pain at home. Pt has had no sickle cell related complications - including no ACS, splenectomy.  PCP: No PCP Per Patient    Past Medical History:  Diagnosis Date  . Sickle cell anemia [facility](HCC)     Patient Active Problem List   Diagnosis Date Noted  . Sickle cell pain crisis (HCC) 08/28/2016    History reviewed. No pertinent surgical history.     Home Medications    Prior to Admission medications   Medication Sig Start Date End Date Taking? Authorizing Provider  indomethacin (INDOCIN) 25 MG capsule Take 1 capsule (25 mg total) by mouth 3 (three) times daily as needed. Patient taking differently: Take 25 mg by mouth 3 (three) times daily as needed for mild pain.  08/25/16  Yes Antony MaduraKelly  Humes, PA-C  HYDROcodone-acetaminophen (NORCO/VICODIN) 5-325 MG tablet Take 2 tablets by mouth every 4 (four) hours as needed. Patient not taking: Reported on 08/25/2016 08/09/16   Rolland PorterMark James, MD    Family History No family history on file.  Social History Social History  Substance Use Topics  . Smoking status: Current Some Day Smoker  . Smokeless tobacco: Never Used  . Alcohol use No     Allergies   Review of patient's allergies indicates no known allergies.   Review of Systems Review of Systems  Constitutional: Negative for chills and fever.  Respiratory: Negative for shortness of breath.   Cardiovascular: Negative for chest pain.  Gastrointestinal: Negative for abdominal pain, nausea and vomiting.  Musculoskeletal: Positive for arthralgias.  Neurological: Positive for headaches.  All other systems reviewed and are negative.    Physical Exam Updated Vital Signs BP 117/74 (BP Location: Right Arm)   Pulse 64   Temp 98 F (36.7 C) (Oral)   Resp 16   Ht 6\' 2"  (1.88 m)   Wt 175 lb (79.4 kg)   SpO2 99%   BMI 22.47 kg/m   Physical Exam  Constitutional: He is oriented to person, place, and time. He appears well-developed and well-nourished.  HENT:  Head: Normocephalic.  Eyes: EOM are normal.  Neck: Normal range of motion.  Cardiovascular: Normal rate, regular rhythm and normal heart sounds.   Pulmonary/Chest: Effort normal and breath sounds normal.  He has no wheezes. He has no rales.  Abdominal: He exhibits no distension.  Musculoskeletal: Normal range of motion. He exhibits edema and tenderness.  L shoulder appears to have some edema. No significant warmth to touch or erythema. Tenderness with movement of the L shoulder but no rigidly or stiffness with movement of the L shoulder.  Neurological: He is alert and oriented to person, place, and time.  Psychiatric: He has a normal mood and affect.  Nursing note and vitals reviewed.    ED Treatments / Results    DIAGNOSTIC STUDIES: Oxygen Saturation is 96% on RA, adequate by my interpretation.    COORDINATION OF CARE: 4:06 AM- Will give Dilaudid and Benadryl. Will order imaging and blood work. Discussed treatment plan with pt at bedside and pt agreed to plan.     Labs (all labs ordered are listed, but only abnormal results are displayed) Labs Reviewed  CBC WITH DIFFERENTIAL/PLATELET - Abnormal; Notable for the following:       Result Value   Hemoglobin 11.6 (*)    HCT 32.1 (*)    MCV 68.7 (*)    MCH 24.8 (*)    MCHC 36.1 (*)    RDW 16.7 (*)    Platelets 78 (*)    All other components within normal limits  COMPREHENSIVE METABOLIC PANEL  RETICULOCYTES  SEDIMENTATION RATE  HEMOGLOBINOPATHY EVALUATION  COMPREHENSIVE METABOLIC PANEL  CBC    EKG  EKG Interpretation None       Radiology Ct Head Wo Contrast  Result Date: 08/28/2016 CLINICAL DATA:  25 y/o M; severe and changing right-sided headache for 1 hour. EXAM: CT HEAD WITHOUT CONTRAST TECHNIQUE: Contiguous axial images were obtained from the base of the skull through the vertex without intravenous contrast. COMPARISON:  None. FINDINGS: Brain: No evidence of acute infarction, hemorrhage, hydrocephalus, extra-axial collection or mass lesion/mass effect. Vascular: No hyperdense vessel or unexpected calcification. Skull: Normal. Negative for fracture or focal lesion. Sinuses/Orbits: Right sphenoid sinus mucous retention cyst. Otherwise negative. Other: None. IMPRESSION: No acute intracranial process identified.  Normal CT of the head. Electronically Signed   By: Mitzi Hansen M.D.   On: 08/28/2016 04:52   Dg Shoulder Left  Result Date: 08/28/2016 CLINICAL DATA:  Acute onset of left arm and shoulder pain. Initial encounter. EXAM: LEFT SHOULDER - 2+ VIEW COMPARISON:  None. FINDINGS: There is no evidence of fracture or dislocation. The left humeral head is seated within the glenoid fossa. The acromioclavicular joint is  unremarkable in appearance. There is no evidence of sclerosis to suggest sequelae of the patient's sickle cell disease. No significant soft tissue abnormalities are seen. The visualized portions of the left lung are clear. IMPRESSION: No evidence of fracture or dislocation. The left glenohumeral joint is unremarkable on radiograph. Electronically Signed   By: Roanna Raider M.D.   On: 08/28/2016 04:34    Procedures Procedures (including critical care time)  Medications Ordered in ED Medications  senna-docusate (Senokot-S) tablet 1 tablet (1 tablet Oral Not Given 08/28/16 1200)  polyethylene glycol (MIRALAX / GLYCOLAX) packet 17 g (not administered)  enoxaparin (LOVENOX) injection 40 mg (40 mg Subcutaneous Not Given 08/28/16 1200)  folic acid (FOLVITE) tablet 1 mg (1 mg Oral Given 08/28/16 1347)  dextrose 5 %-0.45 % sodium chloride infusion ( Intravenous New Bag/Given 08/28/16 1054)  ketorolac (TORADOL) 30 MG/ML injection 30 mg (30 mg Intravenous Given 08/28/16 1753)  HYDROmorphone (DILAUDID) injection 2 mg (2 mg Intravenous Given 08/28/16 1716)  morphine (MS CONTIN) 12  hr tablet 15 mg (15 mg Oral Given 08/28/16 1347)  diphenhydrAMINE (BENADRYL) capsule 25-50 mg (25 mg Oral Given 08/28/16 1015)    Or  diphenhydrAMINE (BENADRYL) 25 mg in sodium chloride 0.9 % 50 mL IVPB ( Intravenous See Alternative 08/28/16 1015)  acetaminophen (TYLENOL) tablet 1,000 mg (1,000 mg Oral Given 08/28/16 1603)  ondansetron (ZOFRAN) injection 4 mg (4 mg Intravenous Given 08/28/16 1642)  promethazine (PHENERGAN) injection 25 mg (not administered)  HYDROmorphone (DILAUDID) injection 2 mg (2 mg Intravenous Given 08/28/16 0421)  diphenhydrAMINE (BENADRYL) injection 25 mg (25 mg Intravenous Given 08/28/16 0421)  HYDROmorphone (DILAUDID) injection 2 mg (2 mg Intravenous Given 08/28/16 0601)  ketorolac (TORADOL) 15 MG/ML injection 15 mg (15 mg Intravenous Given 08/28/16 0601)     Initial Impression / Assessment and  Plan / ED Course  I have reviewed the triage vital signs and the nursing notes.  Pertinent labs & imaging results that were available during my care of the patient were reviewed by me and considered in my medical decision making (see chart for details).  Clinical Course   Pt comes in with sickle cell related pain.  VSS and WNL -  hemodynamically stable  Pain appears vaso-occlusive tpye and typical of previous pain. Will start mild hydration with 1/2 NS while patient is getting her workup. Appropriate labs ordered. Pain control with weight based dilaudid started. Currently, there is no signs of severe decompensation clinically. Will continue to monitor closely. If we are unable to control the pain, we will admit the patient. No clinical concerns for ACS.  Pt's labs show low platelets. Doubt TTP as the cause for headache right now. He has received a dose of toradol for pain, but we will refrain from using more nsaids. Admitting team to follow.   Final Clinical Impressions(s) / ED Diagnoses   Final diagnoses:  Sickle cell pain crisis [facility])    New Prescriptions Current Discharge Medication List    I personally performed the services described in this documentation, which was scribed in my presence. The recorded information has been reviewed and is accurate.    Derwood Kaplan, MD 08/28/16 1756

## 2016-08-28 NOTE — ED Notes (Signed)
Report given. Pt ready for transport. 

## 2016-08-28 NOTE — H&P (Signed)
H&P   Patient Demographics:    Justin Solis, is a 25 y.o. male  MRN: [id number]   DOB - [birth year]  Admit Date - 08/28/2016  Outpatient Primary MD for the patient is No PCP Per Patient  Chief Complaint  Patient presents with  . Sickle Cell Pain Crisis      HPI:   Justin Solis  is a 25 y.o. male who recently relocated to [facility], has been to the ED 3 x in 3 weeks for pain especially in his left arm and shoulder joint radiating to his chest and back attributed to his "sickle cell disease". He was managed on each occasion with IV dilaudid and discharged on percocet but there was no sustained relief. He presented today again with the same complain and after rounds of IV dilaudid, pain remained above 5 and the plan is to admit for pain control. We do not have access to his previous history of sickle cell and its management, patient said he was pain free for the past 7 years until recently. He said his parents both have sickle cell disease and that his father died of complications of SCD. He however also said he is the only one with sickle cell disease out of 5 siblings. He denies any fever, no trauma or injury, no cough or SOB, no urinary symptom. He is not on any medication for SCD but takes Roxicodone prescribed from the EDs. He does not have PCP.   CBCD showed low platelets and +target cells and sickle cell Left shoulder X-Ray: No abnormality CT Head: No acute abnormality   Review of systems:    In addition to the HPI above, patient reports No Fever-chills, No Headache, No changes with Vision or hearing, No problems swallowing food or Liquids, No Chest pain, Cough or Shortness of Breath, No Abdominal pain, No Nausea or Vomiting, Bowel movements are regular No Blood in stool or Urine, No dysuria, No new skin rashes or bruises, No new weakness, tingling, numbness in any extremity, No recent weight gain or loss, No polyuria, polydypsia or polyphagia, No significant Mental  Stressors.  A full 10 point Review of Systems was done, except as stated above, all other Review of Systems were negative.  With Past History of the following :   Past Medical History:  Diagnosis Date  . Sickle cell anemia (HCC)       Social History:   Social History  Substance Use Topics  . Smoking status: Current Some Day Smoker  . Smokeless tobacco: Never Used  . Alcohol use No    Lives - At home   Family History :    No family history on file.   Home Medications:   Prior to Admission medications   Medication Sig Start Date End Date Taking? Authorizing Provider  indomethacin (INDOCIN) 25 MG capsule Take 1 capsule (25 mg total) by mouth 3 (three) times daily as needed. Patient taking differently: Take 25 mg by mouth 3 (three) times daily as needed for mild pain.  08/25/16  Yes Antony Madura, PA-C  HYDROcodone-acetaminophen (NORCO/VICODIN) 5-325 MG tablet Take 2 tablets by mouth every 4 (four) hours as needed. Patient not taking: Reported on 08/25/2016 08/09/16   Rolland Porter, MD     Allergies:    Physical Exam:  Vitals:  Vitals:   08/28/16 0800 08/28/16 0915  BP: 114/73 114/69  Pulse: 77 67  Resp:  18  Temp:  98.3 F (36.8 C)   Physical Exam: Constitutional: Patient appears well-developed  and well-nourished. Not in obvious distress. HENT: Normocephalic, atraumatic, External right and left ear normal. Oropharynx is clear and moist.  Eyes: Conjunctivae and EOM are normal. PERRLA, no scleral icterus. Neck: Normal ROM. Neck supple. No JVD. No tracheal deviation. No thyromegaly. CVS: RRR, S1/S2 +, no murmurs, no gallops, no carotid bruit.  Pulmonary: Effort and breath sounds normal, no stridor, rhonchi, wheezes, rales.  Abdominal: Soft. BS +, no distension, tenderness, rebound or guarding.  Musculoskeletal: Normal range of motion. No edema and no tenderness.  Lymphadenopathy: No lymphadenopathy noted, cervical, inguinal or axillary Neuro: Alert. Normal reflexes, muscle  tone coordination. No cranial nerve deficit. Skin: Skin is warm and dry. No rash noted. Not diaphoretic. No erythema. No pallor. Psychiatric: Normal mood and affect. Behavior, judgment, thought content normal.   Data Review:    CBC  Recent Labs Lab 08/25/16 0132 08/26/16 1732 08/28/16 0321  WBC 7.9 6.8 7.3  HGB 12.4* 11.7* 11.6*  HCT 34.0* 31.0* 32.1*  PLT 83* 87* 78*  MCV 68.5* 66.8* 68.7*  MCH 25.0* 25.2* 24.8*  MCHC 36.5* 37.7* 36.1*  RDW 16.9* 16.9* 16.7*  LYMPHSABS 1.7 1.3 1.2  MONOABS 0.5 0.5 0.5  EOSABS 0.1 0.1 0.1  BASOSABS 0.0 0.0 0.0   ------------------------------------------------------------------------------------------------------------------  Chemistries   Recent Labs Lab 08/25/16 0132 08/26/16 1732 08/28/16 0321  NA 140 142 139  K 3.7 3.9 3.6  CL 108 108 106  CO2 25 28 27   GLUCOSE 124* 86 99  BUN 15 14 9   CREATININE 1.02 1.00 0.84  CALCIUM 9.6 9.3 9.3  AST 23 19 18   ALT 31 21 18   ALKPHOS 53 60 53  BILITOT 1.0 0.9 1.0   ------------------------------------------------------------------------------------------------------------------ estimated creatinine clearance is 149.7 mL/min (by C-G formula based on SCr of 0.84 mg/dL). ------------------------------------------------------------------------------------------------------------------ No results for input(s): TSH, T4TOTAL, T3FREE, THYROIDAB in the last 72 hours.  Invalid input(s): FREET3  Coagulation profile No results for input(s): INR, PROTIME in the last 168 hours. ------------------------------------------------------------------------------------------------------------------- No results for input(s): DDIMER in the last 72 hours. -------------------------------------------------------------------------------------------------------------------  Cardiac Enzymes No results for input(s): CKMB, TROPONINI, MYOGLOBIN in the last 168 hours.  Invalid input(s):  CK ------------------------------------------------------------------------------------------------------------------ No results found for: BNP  ---------------------------------------------------------------------------------------------------------------  Urinalysis No results found for: COLORURINE, APPEARANCEUR, LABSPEC, PHURINE, GLUCOSEU, HGBUR, BILIRUBINUR, KETONESUR, PROTEINUR, UROBILINOGEN, NITRITE, LEUKOCYTESUR  ----------------------------------------------------------------------------------------------------------------   Imaging Results:    Ct Head Wo Contrast  Result Date: 08/28/2016 CLINICAL DATA:  25 y/o M; severe and changing right-sided headache for 1 hour. EXAM: CT HEAD WITHOUT CONTRAST TECHNIQUE: Contiguous axial images were obtained from the base of the skull through the vertex without intravenous contrast. COMPARISON:  None. FINDINGS: Brain: No evidence of acute infarction, hemorrhage, hydrocephalus, extra-axial collection or mass lesion/mass effect. Vascular: No hyperdense vessel or unexpected calcification. Skull: Normal. Negative for fracture or focal lesion. Sinuses/Orbits: Right sphenoid sinus mucous retention cyst. Otherwise negative. Other: None. IMPRESSION: No acute intracranial process identified.  Normal CT of the head. Electronically Signed   By: Mitzi HansenLance  Furusawa-Stratton M.D.   On: 08/28/2016 04:52   Dg Shoulder Left  Result Date: 08/28/2016 CLINICAL DATA:  Acute onset of left arm and shoulder pain. Initial encounter. EXAM: LEFT SHOULDER - 2+ VIEW COMPARISON:  None. FINDINGS: There is no evidence of fracture or dislocation. The left humeral head is seated within the glenoid fossa. The acromioclavicular joint is unremarkable in appearance. There is no evidence of sclerosis to suggest sequelae of the patient's sickle cell disease. No significant soft tissue abnormalities are seen. The  visualized portions of the left lung are clear. IMPRESSION: No evidence of  fracture or dislocation. The left glenohumeral joint is unremarkable on radiograph. Electronically Signed   By: Roanna Raider M.D.   On: 08/28/2016 04:34    Assessment & Plan:    Active Problems:   Sickle cell pain crisis (HCC)  Admit for Observation and pain control  1. Sickle Cell Disease with Pain    Will order hemoglobinopathy evaluation   Start IV Fluid D5.45NS @ 125 mls/hr  IV Dilaudid 2 mg Q 3 H prn   PO MS Contin 15 mg Q 12 H  IV Toradol 30 mg Q 6 H   AM Labs ordered  Patient advised on getting PCP after discharge  2. Thrombocytopenia   Appears chronic and stable  He has no symptom, no bruises or bleeding  Will continue to monitor  DVT Prophylaxis: Subcut Lovenox   AM Labs Ordered, also please review Full Orders  Family Communication: Admission, patient's condition and plan of care including tests being ordered have been discussed with the patient who indicate understanding and agree with the plan and Code Status.  Code Status: Full Code  Consults called: None    Admission status: Inpatient    Time spent in minutes : 50 minutes  Lexiana Spindel MD, MHA, CPE, FACP 08/28/2016 at 9:40 AM

## 2016-08-28 NOTE — ED Triage Notes (Signed)
Patient denies chest pain

## 2016-08-28 NOTE — ED Notes (Signed)
Pt would like labs pulled from IV.  RN aware.  

## 2016-08-28 NOTE — ED Triage Notes (Signed)
Per patient c/o sickle cell pain in his left arm.  States has had the same pain all week.  Been seen x2 this week for the same. Denies chest pain but, does have SOB.   Patient alert and oriented.

## 2016-08-29 DIAGNOSIS — D5700 Hb-SS disease with crisis, unspecified: Secondary | ICD-10-CM | POA: Diagnosis present

## 2016-08-29 DIAGNOSIS — R51 Headache: Secondary | ICD-10-CM

## 2016-08-29 LAB — COMPREHENSIVE METABOLIC PANEL
ALBUMIN: 3.9 g/dL (ref 3.5–5.0)
ALK PHOS: 45 U/L (ref 38–126)
ALT: 13 U/L — ABNORMAL LOW (ref 17–63)
AST: 18 U/L (ref 15–41)
Anion gap: 6 (ref 5–15)
BILIRUBIN TOTAL: 0.8 mg/dL (ref 0.3–1.2)
BUN: 9 mg/dL (ref 6–20)
CALCIUM: 8.9 mg/dL (ref 8.9–10.3)
CO2: 29 mmol/L (ref 22–32)
CREATININE: 1.05 mg/dL (ref 0.61–1.24)
Chloride: 106 mmol/L (ref 101–111)
GFR calc Af Amer: >60 mL/min (ref >60)
GFR calc non Af Amer: >60 mL/min (ref >60)
GLUCOSE: 115 mg/dL — AB (ref 65–99)
Potassium: 4 mmol/L (ref 3.5–5.1)
Sodium: 141 mmol/L (ref 135–145)
TOTAL PROTEIN: 6.1 g/dL — AB (ref 6.5–8.1)

## 2016-08-29 LAB — CBC
HCT: 26.7 % — ABNORMAL LOW (ref 39.0–52.0)
Hemoglobin: 9.7 g/dL — ABNORMAL LOW (ref 13.0–17.0)
MCH: 24.7 pg — ABNORMAL LOW (ref 26.0–34.0)
MCHC: 36.3 g/dL — AB (ref 30.0–36.0)
MCV: 67.9 fL — ABNORMAL LOW (ref 78.0–100.0)
PLATELETS: 62 10*3/uL — AB (ref 150–400)
RBC: 3.93 MIL/uL — ABNORMAL LOW (ref 4.22–5.81)
RDW: 16.4 % — AB (ref 11.5–15.5)
WBC: 6.6 10*3/uL (ref 4.0–10.5)

## 2016-08-29 MED ORDER — SODIUM CHLORIDE 0.9% FLUSH: 9.0000 mL | INTRAVENOUS | Status: DC | PRN

## 2016-08-29 MED ORDER — ONDANSETRON HCL 4 MG/2ML IJ SOLN: 4.0000 mg | Freq: Four times a day (QID) | INTRAMUSCULAR | Status: DC | PRN

## 2016-08-29 MED ORDER — HYDROMORPHONE HCL 1 MG/ML IJ SOLN: 0.5000 mg | INTRAMUSCULAR | Status: DC | PRN

## 2016-08-29 MED ORDER — SUMATRIPTAN SUCCINATE 50 MG PO TABS
50.0000 mg | ORAL_TABLET | Freq: Once | ORAL | Status: AC
  Administered 2016-08-29: 50 mg via ORAL
  Filled 2016-08-29: qty 1

## 2016-08-29 MED ORDER — HYDROMORPHONE 1 MG/ML IV SOLN
INTRAVENOUS | Status: DC
Start: 2016-08-29 — End: 2016-08-31
  Administered 2016-08-29: 25 mg via INTRAVENOUS
  Administered 2016-08-29: 3.5 mg via INTRAVENOUS
  Administered 2016-08-29: 4.5 mg via INTRAVENOUS
  Administered 2016-08-30: 3 mg via INTRAVENOUS
  Administered 2016-08-30: 1 mg via INTRAVENOUS
  Administered 2016-08-30: 1.5 mg via INTRAVENOUS
  Administered 2016-08-30: 20:00:00 via INTRAVENOUS
  Administered 2016-08-30: 3.5 mg via INTRAVENOUS
  Administered 2016-08-30: 0 mg via INTRAVENOUS
  Administered 2016-08-30: 1 mg via INTRAVENOUS
  Administered 2016-08-31: 3 mg via INTRAVENOUS
  Filled 2016-08-29 (×2): qty 25

## 2016-08-29 MED ORDER — NALOXONE HCL 0.4 MG/ML IJ SOLN: 0.4000 mg | INTRAMUSCULAR | Status: DC | PRN

## 2016-08-29 NOTE — Progress Notes (Signed)
SICKLE CELL SERVICE PROGRESS NOTE  Justin Solis ZOX:[id number] DOB: Sep 06, 1991 DOA: 08/28/2016 PCP: No PCP Per Patient  Assessment/Plan: Active Problems:   Sickle cell pain crisis (HCC)  1. Sickle cell disease with crisis: Will discontinue MS Contin and IV Morphine. Start on Dilaudid PCA and give intermittent doses of low dose Dilaudid. Continue Toradol. Decrease IVF to [facility]. I have discussed use of the PCA and the need for adhering to the safety features. Patient is in agreement.  2. Headache: Treat with Imitrex. 3. Anemia of chronic disease: Baseline unknown. However Hb at acceptable levels and patient asymptomatic.  Code Status: Full Code Family Communication: N/A Disposition Plan: Not yet ready for discharge  Justin Solis A.  Pager [phone]. If 7PM-7AM, please contact night-coverage.  08/29/2016, 11:36 AM  LOS: 0 days   Interim  History: This is an opiate naive patient with sickle cell disease who is unsure of his genotype. He received care up until the age of 63 and since then has been lost to care. He reports that he very seldom has sickle cell related pain and does not use opiates on a regular basis. However her reports that in the last 2 weeks he has had significant pain in his left elbow that has not resolved with low dose oxycodone. Since admission his pain has intensifies and is now in the distribution of the entire LUE. He also c/o left temporal throbbing headache which according tot him occurs about 3 x week and seems to be worse with the crisis. He denies any other symptoms.   Consultants:  None  Procedures:  None  Antibiotics:  None   Objective: Vitals:   08/28/16 1751 08/28/16 2156 08/29/16 0624 08/29/16 1011  BP: 117/74 (!) 103/56 114/67 118/69  Pulse: 64 77 88 86  Resp: 16 16 17 16   Temp: 98 F (36.7 C) 98.8 F (37.1 C) 100.1 F (37.8 C) 98.3 F (36.8 C)  TempSrc: Oral Oral Oral Oral  SpO2: 99% 100% 98% 99%  Weight:      Height:        Weight change: 0 kg (0 lb)  Intake/Output Summary (Last 24 hours) at 08/29/16 1136 Last data filed at 08/29/16 0702  Gross per 24 hour  Intake             2220 ml  Output                0 ml  Net             2220 ml    General: Alert, awake, oriented x3, in moderately acute distress.  HEENT: [place]/AT PEERL, EOMI, anicteric Neck: Trachea midline,  no masses, no thyromegal,y no JVD, no carotid bruit OROPHARYNX:  Moist, No exudate/ erythema/lesions.  Heart: Regular rate and rhythm, without murmurs, rubs, gallops, PMI non-displaced, no heaves or thrills on palpation.  Lungs: Clear to auscultation, no wheezing or rhonchi noted. No increased vocal fremitus resonant to percussion. Abdomen: Soft, nontender, nondistended, positive bowel sounds, no masses no hepatosplenomegaly noted..  Neuro: No focal neurological deficits noted cranial nerves II through XII grossly intact.  Strength at functional  in bilateral upper and lower extremities. Musculoskeletal: No warm swelling or erythema around joints, no spinal tenderness noted. Psychiatric: Patient alert and oriented x3, good insight and cognition, good recent to remote recall.    Data Reviewed: Basic Metabolic Panel:  Recent Labs Lab 08/25/16 0132 08/26/16 1732 08/28/16 0321 08/29/16 0415  NA 140 142 139 141  K 3.7 3.9  3.6 4.0  CL 108 108 106 106  CO2 25 28 27 29   GLUCOSE 124* 86 99 115*  BUN 15 14 9 9   CREATININE 1.02 1.00 0.84 1.05  CALCIUM 9.6 9.3 9.3 8.9   Liver Function Tests:  Recent Labs Lab 08/25/16 0132 08/26/16 1732 08/28/16 0321 08/29/16 0415  AST 23 19 18 18   ALT 31 21 18  13*  ALKPHOS 53 60 53 45  BILITOT 1.0 0.9 1.0 0.8  PROT 7.7 7.0 7.3 6.1*  ALBUMIN 4.9 4.6 4.6 3.9   No results for input(s): LIPASE, AMYLASE in the last 168 hours. No results for input(s): AMMONIA in the last 168 hours. CBC:  Recent Labs Lab 08/25/16 0132 08/26/16 1732 08/28/16 0321 08/29/16 0415  WBC 7.9 6.8 7.3 6.6  NEUTROABS  5.6 4.9 5.5  --   HGB 12.4* 11.7* 11.6* 9.7*  HCT 34.0* 31.0* 32.1* 26.7*  MCV 68.5* 66.8* 68.7* 67.9*  PLT 83* 87* 78* 62*   Cardiac Enzymes: No results for input(s): CKTOTAL, CKMB, CKMBINDEX, TROPONINI in the last 168 hours. BNP (last 3 results) No results for input(s): BNP in the last 8760 hours.  ProBNP (last 3 results) No results for input(s): PROBNP in the last 8760 hours.  CBG: No results for input(s): GLUCAP in the last 168 hours.  No results found for this or any previous visit (from the past 240 hour(s)).   Studies: Dg Chest 2 View  Result Date: 08/09/2016 CLINICAL DATA:  Chest pain and shortness of breath. Sickle cell crisis. EXAM: CHEST  2 VIEW COMPARISON:  None. FINDINGS: The lungs are clear. The heart size and pulmonary vascularity are normal. No adenopathy. No bone lesions evident. IMPRESSION: No abnormality noted radiographically. Electronically Signed   By: Bretta BangWilliam  Woodruff III M.D.   On: 08/09/2016 09:19   Ct Head Wo Contrast  Result Date: 08/28/2016 CLINICAL DATA:  25 y/o M; severe and changing right-sided headache for 1 hour. EXAM: CT HEAD WITHOUT CONTRAST TECHNIQUE: Contiguous axial images were obtained from the base of the skull through the vertex without intravenous contrast. COMPARISON:  None. FINDINGS: Brain: No evidence of acute infarction, hemorrhage, hydrocephalus, extra-axial collection or mass lesion/mass effect. Vascular: No hyperdense vessel or unexpected calcification. Skull: Normal. Negative for fracture or focal lesion. Sinuses/Orbits: Right sphenoid sinus mucous retention cyst. Otherwise negative. Other: None. IMPRESSION: No acute intracranial process identified.  Normal CT of the head. Electronically Signed   By: Mitzi HansenLance  Furusawa-Stratton M.D.   On: 08/28/2016 04:52   Ct Angio Chest Pe W Or Wo Contrast  Result Date: 08/09/2016 CLINICAL DATA:  25 year old with sickle cell disease presenting with acute onset of chest pain which began last night.  Elevated D-dimer. EXAM: CT ANGIOGRAPHY CHEST WITH CONTRAST TECHNIQUE: Multidetector CT imaging of the chest was performed using the standard protocol during bolus administration of intravenous contrast. Multiplanar CT image reconstructions and MIPs were obtained to evaluate the vascular anatomy. CONTRAST:  100 mL Isovue 370 IV. COMPARISON:  None. FINDINGS: Technical quality:  Excellent. Pulmonary emboli:  Absent. Cardiovascular: Upper normal heart size. No visible coronary atherosclerosis. No visible atherosclerosis involving thoracic or upper abdominal aorta or their visualized branches. No pericardial effusion. Mediastinum/Nodes: No pathologically enlarged mediastinal, hilar or axillary lymph nodes. No mediastinal masses. Normal-appearing esophagus. Residual thymic tissue in the anterior superior mediastinum. Visualized thyroid gland unremarkable. Lungs/Pleura: Pulmonary parenchyma clear without localized airspace consolidation, interstitial disease, or parenchymal nodules or masses. Central airways patent without significant bronchial wall thickening. No pleural effusions. No pleural plaques  or masses. Upper Abdomen: Unremarkable for the very early arterial phase of enhancement. Musculoskeletal: Regional skeleton intact without acute or significant osseous abnormality. Review of the MIP images confirms the above findings. IMPRESSION: 1. No evidence of pulmonary embolism. 2.  No acute cardiopulmonary disease. Electronically Signed   By: Hulan Saas M.D.   On: 08/09/2016 12:44   Dg Shoulder Left  Result Date: 08/28/2016 CLINICAL DATA:  Acute onset of left arm and shoulder pain. Initial encounter. EXAM: LEFT SHOULDER - 2+ VIEW COMPARISON:  None. FINDINGS: There is no evidence of fracture or dislocation. The left humeral head is seated within the glenoid fossa. The acromioclavicular joint is unremarkable in appearance. There is no evidence of sclerosis to suggest sequelae of the patient's sickle cell  disease. No significant soft tissue abnormalities are seen. The visualized portions of the left lung are clear. IMPRESSION: No evidence of fracture or dislocation. The left glenohumeral joint is unremarkable on radiograph. Electronically Signed   By: Roanna Raider M.D.   On: 08/28/2016 04:34    Scheduled Meds: . enoxaparin (LOVENOX) injection  40 mg Subcutaneous Q24H  . folic acid  1 mg Oral Daily  . HYDROmorphone   Intravenous Q4H  . Influenza vac split quadrivalent PF  0.5 mL Intramuscular Tomorrow-1000  . ketorolac  30 mg Intravenous Q6H  . senna-docusate  1 tablet Oral BID  . SUMAtriptan  50 mg Oral Once   Continuous Infusions: . dextrose 5 % and 0.45% NaCl 125 mL/hr at 08/29/16 1020    Active Problems:   Sickle cell pain crisis (HCC)        In excess of 25 minutes spent during this visit. In excess of 50% involved face to face contact with the patient for assessment, counseling and coordination of care.

## 2016-08-30 ENCOUNTER — Inpatient Hospital Stay (HOSPITAL_COMMUNITY): Payer: Self-pay

## 2016-08-30 DIAGNOSIS — K5900 Constipation, unspecified: Secondary | ICD-10-CM

## 2016-08-30 DIAGNOSIS — R1012 Left upper quadrant pain: Secondary | ICD-10-CM

## 2016-08-30 LAB — COMPREHENSIVE METABOLIC PANEL
ALBUMIN: 4 g/dL (ref 3.5–5.0)
ALK PHOS: 50 U/L (ref 38–126)
ALT: 15 U/L — AB (ref 17–63)
ANION GAP: 3 — AB (ref 5–15)
AST: 25 U/L (ref 15–41)
BILIRUBIN TOTAL: 1.3 mg/dL — AB (ref 0.3–1.2)
BUN: 8 mg/dL (ref 6–20)
CALCIUM: 9.1 mg/dL (ref 8.9–10.3)
CO2: 30 mmol/L (ref 22–32)
CREATININE: 0.99 mg/dL (ref 0.61–1.24)
Chloride: 105 mmol/L (ref 101–111)
GFR calc Af Amer: >60 mL/min (ref >60)
GFR calc non Af Amer: >60 mL/min (ref >60)
GLUCOSE: 112 mg/dL — AB (ref 65–99)
Potassium: 3.9 mmol/L (ref 3.5–5.1)
Sodium: 138 mmol/L (ref 135–145)
TOTAL PROTEIN: 6.8 g/dL (ref 6.5–8.1)

## 2016-08-30 LAB — RETICULOCYTES
RBC.: 3.96 MIL/uL — ABNORMAL LOW (ref 4.22–5.81)
Retic Count, Absolute: 79.2 10*3/uL (ref 19.0–186.0)
Retic Ct Pct: 2 % (ref 0.4–3.1)

## 2016-08-30 LAB — HEMOGLOBIN AND HEMATOCRIT, BLOOD
HCT: 28.1 % — ABNORMAL LOW (ref 39.0–52.0)
HEMOGLOBIN: 10 g/dL — AB (ref 13.0–17.0)

## 2016-08-30 LAB — CBC WITH DIFFERENTIAL/PLATELET
Basophils Absolute: 0 10*3/uL (ref 0.0–0.1)
Basophils Relative: 0 %
EOS ABS: 0.1 10*3/uL (ref 0.0–0.7)
Eosinophils Relative: 1 %
HEMATOCRIT: 27.9 % — AB (ref 39.0–52.0)
HEMOGLOBIN: 9.8 g/dL — AB (ref 13.0–17.0)
LYMPHS ABS: 1.1 10*3/uL (ref 0.7–4.0)
LYMPHS PCT: 10 %
MCH: 24.7 pg — AB (ref 26.0–34.0)
MCHC: 35.1 g/dL (ref 30.0–36.0)
MCV: 70.5 fL — AB (ref 78.0–100.0)
MONOS PCT: 12 %
Monocytes Absolute: 1.3 10*3/uL — ABNORMAL HIGH (ref 0.1–1.0)
NEUTROS PCT: 78 %
Neutro Abs: 8.5 10*3/uL — ABNORMAL HIGH (ref 1.7–7.7)
Platelets: 68 10*3/uL — ABNORMAL LOW (ref 150–400)
RBC: 3.96 MIL/uL — AB (ref 4.22–5.81)
RDW: 16.4 % — ABNORMAL HIGH (ref 11.5–15.5)
WBC: 10.9 10*3/uL — AB (ref 4.0–10.5)

## 2016-08-30 LAB — PREPARE RBC (CROSSMATCH)

## 2016-08-30 LAB — LACTATE DEHYDROGENASE: LDH: 236 U/L — AB (ref 98–192)

## 2016-08-30 LAB — MRSA PCR SCREENING: MRSA by PCR: NEGATIVE

## 2016-08-30 LAB — ABO/RH: ABO/RH(D): O POS

## 2016-08-30 MED ORDER — PIPERACILLIN-TAZOBACTAM 3.375 G IVPB
3.3750 g | Freq: Three times a day (TID) | INTRAVENOUS | Status: DC
  Administered 2016-08-30 – 2016-09-01 (×6): 3.375 g via INTRAVENOUS
  Filled 2016-08-30 (×6): qty 50

## 2016-08-30 MED ORDER — SODIUM CHLORIDE 0.9 % IV SOLN: Freq: Once | INTRAVENOUS | Status: DC

## 2016-08-30 MED ORDER — ACETAMINOPHEN 325 MG PO TABS
650.0000 mg | ORAL_TABLET | Freq: Once | ORAL | Status: DC
  Filled 2016-08-30: qty 2

## 2016-08-30 MED ORDER — IOPAMIDOL (ISOVUE-300) INJECTION 61%
100.0000 mL | Freq: Once | INTRAVENOUS | Status: AC | PRN
  Administered 2016-08-30: 100 mL via INTRAVENOUS

## 2016-08-30 MED ORDER — ACETAMINOPHEN 325 MG PO TABS
650.0000 mg | ORAL_TABLET | Freq: Four times a day (QID) | ORAL | Status: DC | PRN
  Administered 2016-08-30: 650 mg via ORAL

## 2016-08-30 NOTE — Progress Notes (Signed)
Patient ID: Justin Solis, male   DOB: [birth year], 25 y.o.   MRN: [id number]   Spoke with Radiologist who confirmed splenic sequestration onn CT with contrast. I was also able to contact Dr. Maryagnes AmosFuh from [facility] [place] and with patient's verbal permission and for immediate treatment Dr. Maryagnes AmosFuh reports baseline Hb at 12-13 g/dL and genotype as Hb [place]. This information indicate a 2-3 gram drop in Hb.  Pt c/o increased pain in LUQ and is actually in tears due to pain which is a clinical deterioration from this morning.  Focused Examination: Gen: Pt ill but non-toxic appearing. HEENT: Mucosa moist, no exudate or exudate Lungs: Decrease effort secondary to pain in LUQ, but no wheezing or crackles noted. Abdomen: Decreased BS, Soft, LUQ tenderness EXT: No C/C/E.   Assessment and Plan: Pt with Hb [place] with  Acute splenic sequestration. Based on baseline Hb reported  from his previous Physician, he has had a 3 gram drop in Hb. Pt also has a fever currently which is a new development. Plan: - maintain hydration with IVF - transfuse 1 unit RBC's -Serial Hb and Hct every 8 hours and transfuse as needed to keep Hb at >10 g/dl - Obtain BC, U/A, UC, CXR - Start Zosyn - Surgery consult in event  of need for splenctomy - Check CMET   Justin Krajewski A.

## 2016-08-30 NOTE — Progress Notes (Signed)
Pharmacy Antibiotic Note  Justin Solis is a 25 y.o. male admitted on 08/28/2016 with sickle cell pain crisis.  Pharmacy has been consulted for Zosyn dosing for fever in the setting of splenic sequestration.  WBC also elevated.  Plan: Zosyn 3.375g IV q8h (4 hour infusion).  Height: 6\' 2"  (188 cm) Weight: 175 lb (79.4 kg) IBW/kg (Calculated) : 82.2  Temp (24hrs), Avg:99 F (37.2 C), Min:98.4 F (36.9 C), Max:100.7 F (38.2 C)   Recent Labs Lab 08/25/16 0132 08/26/16 1732 08/28/16 0321 08/29/16 0415 08/30/16 1213  WBC 7.9 6.8 7.3 6.6 10.9*  CREATININE 1.02 1.00 0.84 1.05  --     Estimated Creatinine Clearance: 119.7 mL/min (by C-G formula based on SCr of 1.05 mg/dL).    No Known Allergies  Antimicrobials this admission: 10/17 Zosyn >>    Dose adjustments this admission:    Microbiology results: 10/17 BCx: sent  Thank you for allowing pharmacy to be a part of this patient's care.  Maryellen PilePoindexter, [place] Pedigo Trefz, PharmD 08/30/2016 5:22 PM

## 2016-08-30 NOTE — Progress Notes (Signed)
Chaplain paged at physician's request at [phone], chaplain pager signalled at [phone]. Chaplain arrived as Justin. Fenton Solis was being transferred from [facility] to the ICU/Step Down ([facility] [place] to receive more acute care. Among the key stress points for Ms Fenton Solis and his family is that he had come to this hospital and another care facility and felt his medical condition was not properly diagnosed and care for. He is pleased with the care is has been given presently and with the medical teams focus on his individual needs. Family indicates at one past visit Justin Solis was told he did not have Sickle Cell Disease and was discharged from the Emergency Room. This was a high anxiety situation for him, but he returned when his conditions worsened again. Family reports his last episode was in September 2017. Both his mother and father had Sickle Cell Disease. His father died of the disease. His mother was murdered, according to family. Her case is still unsolved. 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   Chaplain was asked to pray for Justin Solis and was further asked to have a day time chaplain visit after he has had time to be treated and process his thoughts.  Page the chaplain if Justin Solis requests or required further spiritual and/or emotional care.  Benjie Karvonenharles D. Nikola Marone, DMin, MDiv Night Chaplain

## 2016-08-30 NOTE — Progress Notes (Signed)
Patient transferred to stepdown ICU per MD order. Report given to Amy RN at ICU.

## 2016-08-30 NOTE — Progress Notes (Signed)
This CM met with pt at bedside to discuss getting set up with a PCP. This CM called and got pt an appointment at the [facility]. The first available appointment was on Dec. 12th. This CM encouraged pt to call and get on a cancellation list to see if he can get seen earlier. Pt agrees to do this. Appointment placed on AVS. CM will continue to follow. Marney Doctor RN,BSN,NCM [phone]

## 2016-08-30 NOTE — Progress Notes (Signed)
SICKLE CELL SERVICE PROGRESS NOTE  Justin MarvelChe-Okee O Solis ZOX:[id number] DOB: Mar 14, 1991 DOA: 08/28/2016 PCP: No PCP Per Patient  Assessment/Plan: Active Problems:   Sickle cell pain crisis (HCC)   Sickle cell anemia with pain (HCC)  1. LUQ pain: Pt having pain and tenderness in the LUQ. He has a dullness in the area of traub's space. Assuming that he has Hb  (which is suspect that he is), He could still have his spleen and at risk for splenic sequestration. Will obtain CBC to follow Hb and obtain a CT abdomen and pelvis with contrast to evaluate for splenic sequestration (discussed with [facility]).  2. Sickle cell disease with crisis: Will continue intermittent doses of low dose Dilaudid and schedule oxycodone after he has had the CT completed. Continue Toradol. [facility]. Patient is in agreement.  3. Headache: Resolved. I suspect that he has migraine headaches associated with his crisis and worsened by opiate use.   4. Anemia of chronic disease: Baseline unknown. Will check Hb and LDH to ensure to drop in Hb that could be related to a process of sequestration. 5. Constipation: Pt has not had a BM in 4 days. Asked nurse to give Miralax.   Code Status: Full Code Family Communication: N/A Disposition Plan: Hoping for discharge today dependent on CT findings.  MATTHEWS,MICHELLE A.  Pager [phone]. If 7PM-7AM, please contact night-coverage.  08/30/2016, 10:58 AM  LOS: 1 day   Interim  History: This is an opiate naive patient with sickle cell disease who is unsure of his genotype. He received care up until the age of 25 and since then has been lost to care. He reports that he very seldom has sickle cell related pain and does not use opiates on a regular basis. However her reports that in the last 2 weeks he has had significant pain in his left elbow that has not resolved with low dose oxycodone. He reports that the pain in the LUE has essentially resolved as has his headache.  He states that use of the PCA yesterday brought the pain down and he has been pain free since during the night. Her had some urinary retention last night but has been voiding spontaneously this morning  Consultants:  None  Procedures:  None  Antibiotics:  None   Objective: Vitals:   08/30/16 0400 08/30/16 0544 08/30/16 0746 08/30/16 0925  BP:  121/62  129/87  Pulse:  (!) 101  91  Resp: (!) 9 16 15 20   Temp:  98.4 F (36.9 C)  98.8 F (37.1 C)  TempSrc:  Oral  Oral  SpO2: 95% 96% 99% 100%  Weight:      Height:       Weight change:   Intake/Output Summary (Last 24 hours) at 08/30/16 1058 Last data filed at 08/30/16 0925  Gross per 24 hour  Intake          2309.83 ml  Output              400 ml  Net          1909.83 ml    General: Alert, awake, oriented x3, in moderate distress due to pain in LUQ.  HEENT: /AT PEERL, EOMI, anicteric Neck: Trachea midline,  no masses, no thyromegal,y no JVD, no carotid bruit OROPHARYNX:  Moist, No exudate/ erythema/lesions.  Heart: Regular rate and rhythm, without murmurs, rubs, gallops, PMI non-displaced, no heaves or thrills on palpation.  Lungs: Clear to auscultation, no wheezing or rhonchi  noted. No increased vocal fremitus resonant to percussion. Abdomen: Soft, LUQ tenderness, nondistended, decreased bowel sounds, no masses questionable splenomegaly noted.  Neuro: No focal neurological deficits noted cranial nerves II through XII grossly intact.  Strength at functional  in bilateral upper and lower extremities. Musculoskeletal: No warm swelling or erythema around joints, no spinal tenderness noted. Psychiatric: Patient alert and oriented x3, good insight and cognition, good recent to remote recall.    Data Reviewed: Basic Metabolic Panel:  Recent Labs Lab 08/25/16 0132 08/26/16 1732 08/28/16 0321 08/29/16 0415  NA 140 142 139 141  K 3.7 3.9 3.6 4.0  CL 108 108 106 106  CO2 25 28 27 29   GLUCOSE 124* 86 99 115*  BUN  15 14 9 9   CREATININE 1.02 1.00 0.84 1.05  CALCIUM 9.6 9.3 9.3 8.9   Liver Function Tests:  Recent Labs Lab 08/25/16 0132 08/26/16 1732 08/28/16 0321 08/29/16 0415  AST 23 19 18 18   ALT 31 21 18  13*  ALKPHOS 53 60 53 45  BILITOT 1.0 0.9 1.0 0.8  PROT 7.7 7.0 7.3 6.1*  ALBUMIN 4.9 4.6 4.6 3.9   No results for input(s): LIPASE, AMYLASE in the last 168 hours. No results for input(s): AMMONIA in the last 168 hours. CBC:  Recent Labs Lab 08/25/16 0132 08/26/16 1732 08/28/16 0321 08/29/16 0415  WBC 7.9 6.8 7.3 6.6  NEUTROABS 5.6 4.9 5.5  --   HGB 12.4* 11.7* 11.6* 9.7*  HCT 34.0* 31.0* 32.1* 26.7*  MCV 68.5* 66.8* 68.7* 67.9*  PLT 83* 87* 78* 62*   Cardiac Enzymes: No results for input(s): CKTOTAL, CKMB, CKMBINDEX, TROPONINI in the last 168 hours. BNP (last 3 results) No results for input(s): BNP in the last 8760 hours.  ProBNP (last 3 results) No results for input(s): PROBNP in the last 8760 hours.  CBG: No results for input(s): GLUCAP in the last 168 hours.  No results found for this or any previous visit (from the past 240 hour(s)).   Studies: Dg Chest 2 View  Result Date: 08/09/2016 CLINICAL DATA:  Chest pain and shortness of breath. Sickle cell crisis. EXAM: CHEST  2 VIEW COMPARISON:  None. FINDINGS: The lungs are clear. The heart size and pulmonary vascularity are normal. No adenopathy. No bone lesions evident. IMPRESSION: No abnormality noted radiographically. Electronically Signed   By: Bretta Bang III M.D.   On: 08/09/2016 09:19   Ct Head Wo Contrast  Result Date: 08/28/2016 CLINICAL DATA:  25 y/o M; severe and changing right-sided headache for 1 hour. EXAM: CT HEAD WITHOUT CONTRAST TECHNIQUE: Contiguous axial images were obtained from the base of the skull through the vertex without intravenous contrast. COMPARISON:  None. FINDINGS: Brain: No evidence of acute infarction, hemorrhage, hydrocephalus, extra-axial collection or mass lesion/mass effect.  Vascular: No hyperdense vessel or unexpected calcification. Skull: Normal. Negative for fracture or focal lesion. Sinuses/Orbits: Right sphenoid sinus mucous retention cyst. Otherwise negative. Other: None. IMPRESSION: No acute intracranial process identified.  Normal CT of the head. Electronically Signed   By: Mitzi Hansen M.D.   On: 08/28/2016 04:52   Ct Angio Chest Pe W Or Wo Contrast  Result Date: 08/09/2016 CLINICAL DATA:  25 year old with sickle cell disease presenting with acute onset of chest pain which began last night. Elevated D-dimer. EXAM: CT ANGIOGRAPHY CHEST WITH CONTRAST TECHNIQUE: Multidetector CT imaging of the chest was performed using the standard protocol during bolus administration of intravenous contrast. Multiplanar CT image reconstructions and MIPs were obtained to evaluate  the vascular anatomy. CONTRAST:  100 mL Isovue 370 IV. COMPARISON:  None. FINDINGS: Technical quality:  Excellent. Pulmonary emboli:  Absent. Cardiovascular: Upper normal heart size. No visible coronary atherosclerosis. No visible atherosclerosis involving thoracic or upper abdominal aorta or their visualized branches. No pericardial effusion. Mediastinum/Nodes: No pathologically enlarged mediastinal, hilar or axillary lymph nodes. No mediastinal masses. Normal-appearing esophagus. Residual thymic tissue in the anterior superior mediastinum. Visualized thyroid gland unremarkable. Lungs/Pleura: Pulmonary parenchyma clear without localized airspace consolidation, interstitial disease, or parenchymal nodules or masses. Central airways patent without significant bronchial wall thickening. No pleural effusions. No pleural plaques or masses. Upper Abdomen: Unremarkable for the very early arterial phase of enhancement. Musculoskeletal: Regional skeleton intact without acute or significant osseous abnormality. Review of the MIP images confirms the above findings. IMPRESSION: 1. No evidence of pulmonary embolism.  2.  No acute cardiopulmonary disease. Electronically Signed   By: Hulan Saas M.D.   On: 08/09/2016 12:44   Dg Shoulder Left  Result Date: 08/28/2016 CLINICAL DATA:  Acute onset of left arm and shoulder pain. Initial encounter. EXAM: LEFT SHOULDER - 2+ VIEW COMPARISON:  None. FINDINGS: There is no evidence of fracture or dislocation. The left humeral head is seated within the glenoid fossa. The acromioclavicular joint is unremarkable in appearance. There is no evidence of sclerosis to suggest sequelae of the patient's sickle cell disease. No significant soft tissue abnormalities are seen. The visualized portions of the left lung are clear. IMPRESSION: No evidence of fracture or dislocation. The left glenohumeral joint is unremarkable on radiograph. Electronically Signed   By: Roanna Raider M.D.   On: 08/28/2016 04:34    Scheduled Meds: . folic acid  1 mg Oral Daily  . HYDROmorphone   Intravenous Q4H  . ketorolac  30 mg Intravenous Q6H  . senna-docusate  1 tablet Oral BID   Continuous Infusions: . dextrose 5 % and 0.45% NaCl 1,000 mL (08/30/16 0154)    Active Problems:   Sickle cell pain crisis (HCC)   Sickle cell anemia with pain (HCC)        In excess of 35 minutes spent during this visit. In excess of 50% involved face to face contact with the patient for assessment, counseling and coordination of care.

## 2016-08-30 NOTE — Progress Notes (Signed)
3mL of Dilaudid wasted bedside with Porfirio OarSam Hogue RN. Presenter, broadcastingWaste din [place]

## 2016-08-31 ENCOUNTER — Inpatient Hospital Stay (HOSPITAL_COMMUNITY): Payer: Self-pay

## 2016-08-31 LAB — CBC WITH DIFFERENTIAL/PLATELET
Basophils Absolute: 0 10*3/uL (ref 0.0–0.1)
Basophils Relative: 0 %
EOS ABS: 0.3 10*3/uL (ref 0.0–0.7)
EOS PCT: 3 %
HEMATOCRIT: 25.4 % — AB (ref 39.0–52.0)
HEMOGLOBIN: 9.2 g/dL — AB (ref 13.0–17.0)
Lymphocytes Relative: 10 %
Lymphs Abs: 1 10*3/uL (ref 0.7–4.0)
MCH: 25.4 pg — AB (ref 26.0–34.0)
MCHC: 36.2 g/dL — AB (ref 30.0–36.0)
MCV: 70.2 fL — AB (ref 78.0–100.0)
MONO ABS: 1.3 10*3/uL — AB (ref 0.1–1.0)
Monocytes Relative: 13 %
Neutro Abs: 7 10*3/uL (ref 1.7–7.7)
Neutrophils Relative %: 74 %
Platelets: 65 10*3/uL — ABNORMAL LOW (ref 150–400)
RBC: 3.62 MIL/uL — AB (ref 4.22–5.81)
RDW: 16.3 % — ABNORMAL HIGH (ref 11.5–15.5)
WBC: 9.5 10*3/uL (ref 4.0–10.5)

## 2016-08-31 LAB — HEMOGLOBIN AND HEMATOCRIT, BLOOD
HEMATOCRIT: 27.1 % — AB (ref 39.0–52.0)
HEMATOCRIT: 27.2 % — AB (ref 39.0–52.0)
HEMATOCRIT: 25.7 % — AB (ref 39.0–52.0)
HEMOGLOBIN: 9.6 g/dL — AB (ref 13.0–17.0)
HEMOGLOBIN: 9.7 g/dL — AB (ref 13.0–17.0)
Hemoglobin: 9.2 g/dL — ABNORMAL LOW (ref 13.0–17.0)

## 2016-08-31 LAB — URINALYSIS, ROUTINE W REFLEX MICROSCOPIC
Bilirubin Urine: NEGATIVE
GLUCOSE, UA: NEGATIVE mg/dL
Hgb urine dipstick: NEGATIVE
KETONES UR: NEGATIVE mg/dL
LEUKOCYTES UA: NEGATIVE
NITRITE: NEGATIVE
PROTEIN: NEGATIVE mg/dL
Specific Gravity, Urine: 1.014 (ref 1.005–1.030)
pH: 6 (ref 5.0–8.0)

## 2016-08-31 LAB — PREPARE RBC (CROSSMATCH)

## 2016-08-31 LAB — HEMOGLOBINOPATHY EVALUATION
HGB A2 QUANT: 5 % — AB (ref 0.7–3.1)
HGB A: 0 % — AB (ref 94.0–98.0)
HGB C: 45.1 % — AB
HGB S QUANTITAION: 49.9 % — AB
Hgb F Quant: 0 % (ref 0.0–2.0)

## 2016-08-31 LAB — RETICULOCYTES
RBC.: 3.62 MIL/uL — ABNORMAL LOW (ref 4.22–5.81)
Retic Count, Absolute: 68.8 10*3/uL (ref 19.0–186.0)
Retic Ct Pct: 1.9 % (ref 0.4–3.1)

## 2016-08-31 MED ORDER — OXYCODONE HCL 5 MG PO TABS
5.0000 mg | ORAL_TABLET | ORAL | Status: DC
  Filled 2016-08-31: qty 1

## 2016-08-31 MED ORDER — MORPHINE SULFATE 15 MG PO TABS
15.0000 mg | ORAL_TABLET | ORAL | Status: DC | PRN
  Administered 2016-09-01 (×2): 15 mg via ORAL
  Filled 2016-08-31 (×2): qty 1

## 2016-08-31 MED ORDER — MAGNESIUM CITRATE PO SOLN
1.0000 | Freq: Once | ORAL | Status: AC
  Administered 2016-08-31: 1 via ORAL
  Filled 2016-08-31: qty 296

## 2016-08-31 MED ORDER — SODIUM CHLORIDE 0.9 % IV SOLN: Freq: Once | INTRAVENOUS | Status: DC

## 2016-08-31 MED ORDER — DEXTROSE-NACL 5-0.45 % IV SOLN: INTRAVENOUS | Status: DC

## 2016-08-31 NOTE — Progress Notes (Signed)
Patient ID: Justin Solis, male   DOB: 04/13/1991, 25 y.o.   MRN: [id number] [facility] Surgery Progress Note:   * No surgery found *  Subjective: Mental status is clear.  Patient is a little more comfortable with IV pain meds Objective: Vital signs in last 24 hours: Temp:  [98.4 F (36.9 C)-101 F (38.3 C)] 98.6 F (37 C) (10/18 0400) Pulse Rate:  [80-105] 81 (10/18 0700) Resp:  [11-32] 12 (10/18 0700) BP: (105-150)/(56-90) 110/63 (10/18 0700) SpO2:  [94 %-100 %] 98 % (10/18 0700) Weight:  [80.3 kg (177 lb 0.5 oz)] 80.3 kg (177 lb 0.5 oz) (10/17 1744)  Intake/Output from previous day: 10/17 0701 - 10/18 0700 In: 945.5 [P.O.:480; I.V.:440.5; IV Piggyback:25] Out: 400 [Urine:400] Intake/Output this shift: No intake/output data recorded.  Physical Exam: Work of breathing is effected and restricted by pain with inspiration on the left.  Not air hungry or labored  Lab Results:  Results for orders placed or performed during the hospital encounter of 08/28/16 (from the past 48 hour(s))  CBC with Differential/Platelet     Status: Abnormal   Collection Time: 08/30/16 12:13 PM  Result Value Ref Range   WBC 10.9 (H) 4.0 - 10.5 K/uL   RBC 3.96 (L) 4.22 - 5.81 MIL/uL   Hemoglobin 9.8 (L) 13.0 - 17.0 g/dL   HCT 27.9 (L) 39.0 - 52.0 %   MCV 70.5 (L) 78.0 - 100.0 fL   MCH 24.7 (L) 26.0 - 34.0 pg   MCHC 35.1 30.0 - 36.0 g/dL   RDW 16.4 (H) 11.5 - 15.5 %   Platelets 68 (L) 150 - 400 K/uL    Comment: CONSISTENT WITH PREVIOUS RESULT   Neutrophils Relative % 78 %   Neutro Abs 8.5 (H) 1.7 - 7.7 K/uL   Lymphocytes Relative 10 %   Lymphs Abs 1.1 0.7 - 4.0 K/uL   Monocytes Relative 12 %   Monocytes Absolute 1.3 (H) 0.1 - 1.0 K/uL   Eosinophils Relative 1 %   Eosinophils Absolute 0.1 0.0 - 0.7 K/uL   Basophils Relative 0 %   Basophils Absolute 0.0 0.0 - 0.1 K/uL  Reticulocytes     Status: Abnormal   Collection Time: 08/30/16 12:13 PM  Result Value Ref Range   Retic Ct Pct 2.0 0.4  - 3.1 %   RBC. 3.96 (L) 4.22 - 5.81 MIL/uL   Retic Count, Manual 79.2 19.0 - 186.0 K/uL  Lactate dehydrogenase     Status: Abnormal   Collection Time: 08/30/16 12:13 PM  Result Value Ref Range   LDH 236 (H) 98 - 192 U/L  Type and screen [place]     Status: None (Preliminary result)   Collection Time: 08/30/16  5:15 PM  Result Value Ref Range   ABO/RH(D) O POS    Antibody Screen NEG    Sample Expiration 09/02/2016    PT AG Type      POSITIVE FOR C ANTIGEN POSITIVE FOR c ANTIGEN NEGATIVE FOR E ANTIGEN POSITIVE FOR e ANTIGEN NEGATIVE FOR KELL ANTIGEN NEGATIVE FOR S ANTIGEN POSITIVE FOR s ANTIGEN NEGATIVE FOR DUFFY A ANTIGEN POSITIVE FOR DUFFY B ANTIGEN POSITIVE FOR KIDD A ANTIGEN  POSITIVE FOR KIDD B ANTIGEN POSITIVE FOR M ANTIGEN    Unit Number [id number]    Blood Component Type RBC LR PHER2    Unit division 00    Status of Unit ALLOCATED    Transfusion Status OK TO TRANSFUSE    Crossmatch Result Compatible  Donor AG Type NEGATIVE FOR E ANTIGEN NEGATIVE FOR KELL ANTIGEN    Unit Number [id number]    Blood Component Type RBC LR PHER1    Unit division 00    Status of Unit ALLOCATED    Transfusion Status OK TO TRANSFUSE    Crossmatch Result Compatible    Donor AG Type NEGATIVE FOR E ANTIGEN NEGATIVE FOR KELL ANTIGEN    Unit Number [id number]    Blood Component Type RED CELLS,LR    Unit division 00    Status of Unit REL FROM [facility]    Donor AG Type NEGATIVE FOR E ANTIGEN NEGATIVE FOR KELL ANTIGEN    Transfusion Status OK TO TRANSFUSE    Crossmatch Result Compatible   Prepare RBC     Status: None   Collection Time: 08/30/16  5:15 PM  Result Value Ref Range   Order Confirmation ORDER PROCESSED BY BLOOD BANK   Hemoglobin and hematocrit, blood     Status: Abnormal   Collection Time: 08/30/16  5:15 PM  Result Value Ref Range   Hemoglobin 10.0 (L) 13.0 - 17.0 g/dL   HCT 28.1 (L) 39.0 - 52.0 %  Comprehensive metabolic panel     Status: Abnormal    Collection Time: 08/30/16  5:15 PM  Result Value Ref Range   Sodium 138 135 - 145 mmol/L   Potassium 3.9 3.5 - 5.1 mmol/L   Chloride 105 101 - 111 mmol/L   CO2 30 22 - 32 mmol/L   Glucose, Bld 112 (H) 65 - 99 mg/dL   BUN 8 6 - 20 mg/dL   Creatinine, Ser 0.99 0.61 - 1.24 mg/dL   Calcium 9.1 8.9 - 10.3 mg/dL   Total Protein 6.8 6.5 - 8.1 g/dL   Albumin 4.0 3.5 - 5.0 g/dL   AST 25 15 - 41 U/L   ALT 15 (L) 17 - 63 U/L   Alkaline Phosphatase 50 38 - 126 U/L   Total Bilirubin 1.3 (H) 0.3 - 1.2 mg/dL   GFR calc non Af Amer >60 >60 mL/min   GFR calc Af Amer >60 >60 mL/min    Comment: (NOTE) The eGFR has been calculated using the CKD EPI equation. This calculation has not been validated in all clinical situations. eGFR's persistently <60 mL/min signify possible Chronic Kidney Disease.    Anion gap 3 (L) 5 - 15  ABO/Rh     Status: None   Collection Time: 08/30/16  5:15 PM  Result Value Ref Range   ABO/RH(D) O POS   MRSA PCR Screening     Status: None   Collection Time: 08/30/16  5:41 PM  Result Value Ref Range   MRSA by PCR NEGATIVE NEGATIVE    Comment:        The GeneXpert MRSA Assay (FDA approved for NASAL specimens only), is one component of a comprehensive MRSA colonization surveillance program. It is not intended to diagnose MRSA infection nor to guide or monitor treatment for MRSA infections.   Hemoglobin and hematocrit, blood     Status: Abnormal   Collection Time: 08/31/16 12:57 AM  Result Value Ref Range   Hemoglobin 9.7 (L) 13.0 - 17.0 g/dL   HCT 27.2 (L) 39.0 - 52.0 %  Prepare RBC     Status: None   Collection Time: 08/31/16  5:33 AM  Result Value Ref Range   Order Confirmation ORDER PROCESSED BY BLOOD BANK     Radiology/Results: Ct Abdomen Pelvis W Wo Contrast  Result Date: 08/30/2016 CLINICAL  DATA:  Left upper quadrant abdominal pain and tenderness. EXAM: CT ABDOMEN AND PELVIS WITHOUT AND WITH CONTRAST TECHNIQUE: Multidetector CT imaging of the abdomen  and pelvis was performed following the standard protocol before and following the bolus administration of intravenous contrast. CONTRAST:  157m ISOVUE-300 IOPAMIDOL (ISOVUE-300) INJECTION 61% COMPARISON:  08/09/2016 chest CT FINDINGS: Lower chest: Trace bilateral pleural effusions. Low-density precontrast blood pool favoring anemia. Hepatobiliary: Contracted gallbladder.  Otherwise unremarkable. Pancreas: Unremarkable Spleen: Spleen measures proximally 11.0 by 7.5 by 19.1 cm (volume = 830 cm^3), with a large region of abnormal splenic hypoenhancement inferiorly favoring splenic sequestration, and irregularity at the interface between the hypoenhancing abnormally enhancing spleen. This hypoenhancing portion is about half of the splenic volume. Adrenals/Urinary Tract: Unremarkable Stomach/Bowel: Unremarkable Vascular/Lymphatic: Unremarkable Reproductive: Unremarkable Other: Small amount of pelvic ascites. Small amount of perisplenic and perihepatic ascites. Musculoskeletal: Unremarkable IMPRESSION: 1. 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 There is a small amount of perisplenic and perihepatic ascites as well as a small amount of pelvic ascites. 2. Small bilateral pleural effusions. 3. Low-density blood pool favoring anemia. These results were called by telephone at the time of interpretation on 08/30/2016 at 4:36 pm to Dr. MSharyn LullMATTHEWS , who verbally acknowledged these results. Electronically Signed   By: WVan ClinesM.D.   On: 08/30/2016 16:38   Dg Chest Port 1 View  Result Date: 08/30/2016 CLINICAL DATA:  Fever. EXAM: PORTABLE CHEST 1 VIEW COMPARISON:  Radiographs of August 09, 2016. FINDINGS: The heart size and mediastinal contours are within normal limits. Both lungs are clear. No pneumothorax or pleural effusion is noted. The visualized skeletal structures are unremarkable. IMPRESSION: No  acute cardiopulmonary abnormality seen. Electronically Signed   By: JMarijo Conception M.D.   On: 08/30/2016 20:11    Anti-infectives: Anti-infectives    Start     Dose/Rate Route Frequency Ordered Stop   08/30/16 1800  piperacillin-tazobactam (ZOSYN) IVPB 3.375 g     3.375 g 12.5 mL/hr over 240 Minutes Intravenous Every 8 hours 08/30/16 1721        Assessment/Plan: Problem List: Patient Active Problem List   Diagnosis Date Noted  . Sickle cell anemia with pain ([facility] [place] 08/29/2016  . Sickle cell pain crisis ([facility] 08/28/2016    Workup for splenic sequestration in progress.  Will rely on Dr. MZigmund Danielguidance regarding need for splenectomy.  CCS can provide this should the need arise.  We will follow with you.   * No surgery found *    LOS: 2 days   Matt B. MHassell Done MD, FNewport HospitalSur[facility], P.[facility] [phone] [phone] 08/31/2016 8:25 AM

## 2016-08-31 NOTE — Progress Notes (Signed)
17cc of hydromorphone PCA wasted in sink. Witnessed by Beatrice LecherKIM Cheek RN

## 2016-08-31 NOTE — Care Management Note (Signed)
Case Management Note  Patient Details  Name: Ignacia MarvelChe-Okee O Previti MRN: [id number] Date of Birth: 10/29/1991  Subjective/Objective:     Sickle cell crisis               Action/Plan: home   Expected Discharge Date:                  Expected Discharge Plan:  Home/Self Care  In-House Referral:     Discharge planning Services     Post Acute Care Choice:    Choice offered to:     DME Arranged:    DME Agency:     HH Arranged:    HH Agency:     Status of Service:  In process, will continue to follow  If discussed at Long Length of Stay Meetings, dates discussed:    Additional Comments:Date:  August 31, 2016 Chart reviewed for concurrent status and case management needs. Will continue to follow the patient for status change: Discharge Planning: following for needs Expected discharge date: [id number] Marcelle SmilingRhonda Davis, BSN, [place] [facility]RN[facility], [place]   [phone]  Golda Acreavis, Rhonda Lynn, RN 08/31/2016, 10:06 AM

## 2016-08-31 NOTE — Progress Notes (Signed)
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 Spleen still enlarged by clinical examination, however only minimal decrease in Hb since last night.  2. Fever: Pt has had no further fevers since yesterday. Urinalysis ordered but not yet collected. So far blood cultures negative. 3. Hypoxemia: Pt demonstrating a mild hypoxemia as his baseline in 100%. He has had a 3 gram decrease in Hb so will transfuse 1 unit RBC.  4. Sickle cell disease with crisis: Pt reports pain down to 2/10. Will change to oral analgesic- Oxycodone 5 mg every 4 hours and continue PRN clincian assisted doses.  5. Headache: Resolved. I suspect that he has migraine headaches associated with his crisis and worsened by opiate use.   6. Anemia of chronic disease: Baseline Hb is 12-13 g/dL. Will transfuse 1 unit RBC in light of 3 gram decrease in Hb. 7. Constipation: Pt has not had a BM in 4 days. Asked nurse to give Miralax. If no BM will give Magnesium Citrate.    Code Status: Full Code Family Communication: N/A Disposition Plan: Not yet ready for discahrge  Deniz Eskridge A.  Pager [phone]. If 7PM-7AM, please contact night-coverage.  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 Pt has exquisite pain yesterday however today it is down to 2/10 in the LUQ and the pain in his LUE has resolved completely. He is tolerating diet without any difficulty but still ahs not had a BM for 5 days.   Consultants:  Surgery  Procedures:  None  Antibiotics:  Zosyn 10/17>>   Objective: Vitals:   08/31/16 0600 08/31/16 0700 08/31/16 0800 08/31/16 0913  BP: 105/60  110/63 130/82 117/75  Pulse: 85 81 87 91  Resp: 12 12 20 16   Temp:   98.9 F (37.2 C)   TempSrc:   Oral   SpO2: 98% 98% 100% 98%  Weight:      Height:       Weight change:   Intake/Output Summary (Last 24 hours) at 08/31/16 0946 Last data filed at 08/31/16 0900  Gross per 24 hour  Intake           2055.5 ml  Output              700 ml  Net           1355.5 ml    General: Sleepy but arousable, oriented x3, in no apparent distress.  HEENT: [place]/AT PEERL, EOMI, anicteric Neck: Trachea midline,  no masses, no thyromegal,y no JVD, no carotid bruit OROPHARYNX:  Moist, No exudate/ erythema/lesions.  Heart: Regular rate and rhythm, without murmurs, rubs, gallops, PMI non-displaced, no heaves or thrills on palpation.  Lungs: Clear to auscultation, no wheezing or rhonchi noted. No increased vocal fremitus resonant to percussion. Abdomen: Soft, mild LUQ tenderness, nondistended, decreased bowel sounds, no masses dullness noted in traub's space.  Neuro: No focal neurological deficits noted cranial nerves II through XII grossly intact.  Strength at functional  in bilateral upper and lower extremities. Musculoskeletal: No warmth swelling or erythema around joints, no spinal tenderness noted. Psychiatric: Patient alert and oriented x3, good insight and cognition, good recent to remote recall.    Data Reviewed:  Basic Metabolic Panel:  Recent Labs Lab 08/25/16 0132 08/26/16 1732 08/28/16 0321 08/29/16 0415 08/30/16 1715  NA 140 142 139 141 138  K 3.7 3.9 3.6 4.0 3.9  CL 108 108 106 106 105  CO2 25 28 27 29 30   GLUCOSE 124* 86 99 115* 112*  BUN 15 14 9 9 8   CREATININE 1.02 1.00 0.84 1.05 0.99  CALCIUM 9.6 9.3 9.3 8.9 9.1   Liver Function Tests:  Recent Labs Lab 08/25/16 0132 08/26/16 1732 08/28/16 0321 08/29/16 0415 08/30/16 1715  AST 23 19 18 18 25   ALT 31 21 18  13* 15*  ALKPHOS 53 60 53 45 50  BILITOT 1.0 0.9 1.0 0.8 1.3*  PROT 7.7 7.0 7.3 6.1* 6.8  ALBUMIN 4.9 4.6 4.6  3.9 4.0   No results for input(s): LIPASE, AMYLASE in the last 168 hours. No results for input(s): AMMONIA in the last 168 hours. CBC:  Recent Labs Lab 08/25/16 0132 08/26/16 1732 08/28/16 0321 08/29/16 0415 08/30/16 1213 08/30/16 1715 08/31/16 0057 08/31/16 0820  WBC 7.9 6.8 7.3 6.6 10.9*  --   --  9.5  NEUTROABS 5.6 4.9 5.5  --  8.5*  --   --  7.0  HGB 12.4* 11.7* 11.6* 9.7* 9.8* 10.0* 9.7* 9.2*  9.2*  HCT 34.0* 31.0* 32.1* 26.7* 27.9* 28.1* 27.2* 25.4*  25.7*  MCV 68.5* 66.8* 68.7* 67.9* 70.5*  --   --  70.2*  PLT 83* 87* 78* 62* 68*  --   --  65*   Cardiac Enzymes: No results for input(s): CKTOTAL, CKMB, CKMBINDEX, TROPONINI in the last 168 hours. BNP (last 3 results) No results for input(s): BNP in the last 8760 hours.  ProBNP (last 3 results) No results for input(s): PROBNP in the last 8760 hours.  CBG: No results for input(s): GLUCAP in the last 168 hours.  Recent Results (from the past 240 hour(s))  MRSA PCR Screening     Status: None   Collection Time: 08/30/16  5:41 PM  Result Value Ref Range Status   MRSA by PCR NEGATIVE NEGATIVE Final    Comment:        The GeneXpert MRSA Assay (FDA approved for NASAL specimens only), is one component of a comprehensive MRSA colonization surveillance program. It is not intended to diagnose MRSA infection nor to guide or monitor treatment for MRSA infections.      Studies: Ct Abdomen Pelvis W Wo Contrast  Result Date: 08/30/2016 CLINICAL DATA:  Left upper quadrant abdominal pain and tenderness. EXAM: CT ABDOMEN AND PELVIS WITHOUT AND WITH CONTRAST TECHNIQUE: Multidetector CT imaging of the abdomen and pelvis was performed following the standard protocol before and following the bolus administration of intravenous contrast. CONTRAST:  ISOVUE-300 IOPAMIDOL (ISOVUE-300) INJECTION 61% COMPARISON:  08/09/2016 chest CT FINDINGS: Lower chest: Trace bilateral pleural effusions. Low-density precontrast blood pool  favoring anemia. Hepatobiliary: Contracted gallbladder.  Otherwise unremarkable. Pancreas: Unremarkable Spleen: Spleen measures proximally 11.0 by 7.5 by 19.1 cm (volume = 830 cm^3), with a large region of abnormal splenic hypoenhancement inferiorly favoring splenic sequestration, and irregularity at the interface between the hypoenhancing abnormally enhancing spleen. This hypoenhancing portion is about half of the splenic volume. Adrenals/Urinary Tract: Unremarkable Stomach/Bowel: Unremarkable Vascular/Lymphatic: Unremarkable Reproductive: Unremarkable Other: Small amount of pelvic ascites. Small amount of perisplenic and perihepatic ascites. Musculoskeletal: Unremarkable IMPRESSION: 1. 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 There is a small amount of perisplenic and perihepatic  ascites as well as a small amount of pelvic ascites. 2. Small bilateral pleural effusions. 3. Low-density blood pool favoring anemia. These results were called by telephone at the time of interpretation on 08/30/2016 at 4:36 pm to Dr. Marcelino DusterMICHELLE Jeffree Cazeau , who verbally acknowledged these results. Electronically Signed   By: Gaylyn RongWalter  Liebkemann M.D.   On: 08/30/2016 16:38   Dg Chest 2 View  Result Date: 08/09/2016 CLINICAL DATA:  Chest pain and shortness of breath. Sickle cell crisis. EXAM: CHEST  2 VIEW COMPARISON:  None. FINDINGS: The lungs are clear. The heart size and pulmonary vascularity are normal. No adenopathy. No bone lesions evident. IMPRESSION: No abnormality noted radiographically. Electronically Signed   By: Bretta BangWilliam  Woodruff III M.D.   On: 08/09/2016 09:19   Ct Head Wo Contrast  Result Date: 08/28/2016 CLINICAL DATA:  25 y/o M; severe and changing right-sided headache for 1 hour. EXAM: CT HEAD WITHOUT CONTRAST TECHNIQUE: Contiguous axial images were obtained from the base of the skull through the vertex without  intravenous contrast. COMPARISON:  None. FINDINGS: Brain: No evidence of acute infarction, hemorrhage, hydrocephalus, extra-axial collection or mass lesion/mass effect. Vascular: No hyperdense vessel or unexpected calcification. Skull: Normal. Negative for fracture or focal lesion. Sinuses/Orbits: Right sphenoid sinus mucous retention cyst. Otherwise negative. Other: None. IMPRESSION: No acute intracranial process identified.  Normal CT of the head. Electronically Signed   By: Mitzi HansenLance  Furusawa-Stratton M.D.   On: 08/28/2016 04:52   Ct Angio Chest Pe W Or Wo Contrast  Result Date: 08/09/2016 CLINICAL DATA:  25 year old with sickle cell disease presenting with acute onset of chest pain which began last night. Elevated D-dimer. EXAM: CT ANGIOGRAPHY CHEST WITH CONTRAST TECHNIQUE: Multidetector CT imaging of the chest was performed using the standard protocol during bolus administration of intravenous contrast. Multiplanar CT image reconstructions and MIPs were obtained to evaluate the vascular anatomy. CONTRAST:  100 mL Isovue 370 IV. COMPARISON:  None. FINDINGS: Technical quality:  Excellent. Pulmonary emboli:  Absent. Cardiovascular: Upper normal heart size. No visible coronary atherosclerosis. No visible atherosclerosis involving thoracic or upper abdominal aorta or their visualized branches. No pericardial effusion. Mediastinum/Nodes: No pathologically enlarged mediastinal, hilar or axillary lymph nodes. No mediastinal masses. Normal-appearing esophagus. Residual thymic tissue in the anterior superior mediastinum. Visualized thyroid gland unremarkable. Lungs/Pleura: Pulmonary parenchyma clear without localized airspace consolidation, interstitial disease, or parenchymal nodules or masses. Central airways patent without significant bronchial wall thickening. No pleural effusions. No pleural plaques or masses. Upper Abdomen: Unremarkable for the very early arterial phase of enhancement. Musculoskeletal: Regional  skeleton intact without acute or significant osseous abnormality. Review of the MIP images confirms the above findings. IMPRESSION: 1. No evidence of pulmonary embolism. 2.  No acute cardiopulmonary disease. Electronically Signed   By: Hulan Saashomas  Lawrence M.D.   On: 08/09/2016 12:44   Dg Chest Port 1 View  Result Date: 08/30/2016 CLINICAL DATA:  Fever. EXAM: PORTABLE CHEST 1 VIEW COMPARISON:  Radiographs of August 09, 2016. FINDINGS: The heart size and mediastinal contours are within normal limits. Both lungs are clear. No pneumothorax or pleural effusion is noted. The visualized skeletal structures are unremarkable. IMPRESSION: No acute cardiopulmonary abnormality seen. Electronically Signed   By: Lupita RaiderJames  Green Jr, M.D.   On: 08/30/2016 20:11   Dg Shoulder Left  Result Date: 08/28/2016 CLINICAL DATA:  Acute onset of left arm and shoulder pain. Initial encounter. EXAM: LEFT SHOULDER - 2+ VIEW COMPARISON:  None. FINDINGS: There is no evidence of fracture or dislocation. The left humeral  head is seated within the glenoid fossa. The acromioclavicular joint is unremarkable in appearance. There is no evidence of sclerosis to suggest sequelae of the patient's sickle cell disease. No significant soft tissue abnormalities are seen. The visualized portions of the left lung are clear. IMPRESSION: No evidence of fracture or dislocation. The left glenohumeral joint is unremarkable on radiograph. Electronically Signed   By: Roanna Raider M.D.   On: 08/28/2016 04:34    Scheduled Meds: . sodium chloride   Intravenous Once  . sodium chloride   Intravenous Once  . sodium chloride   Intravenous Once  . sodium chloride   Intravenous Once  . acetaminophen  650 mg Oral Once  . folic acid  1 mg Oral Daily  . HYDROmorphone   Intravenous Q4H  . ketorolac  30 mg Intravenous Q6H  . piperacillin-tazobactam (ZOSYN)  IV  3.375 g Intravenous Q8H  . senna-docusate  1 tablet Oral BID   Continuous Infusions: . dextrose 5 %  and 0.45% NaCl 100 mL/hr at 08/30/16 2000    Active Problems:   Sickle cell pain crisis (HCC)   Sickle cell anemia with pain (HCC)    In excess of 50 minutes spent during this visit. In excess of 50% involved face to face contact with the patient for assessment, counseling and coordination of care.

## 2016-08-31 NOTE — Progress Notes (Signed)
Nurse reports that patient reports that pain currently 0/10 and that patient states that Oxycodone makes him vomit. Will order Morphine Sulfate IR 15 mg every 4 hours as needed.

## 2016-09-01 DIAGNOSIS — D5702 Hb-SS disease with splenic sequestration: Secondary | ICD-10-CM

## 2016-09-01 DIAGNOSIS — J181 Lobar pneumonia, unspecified organism: Secondary | ICD-10-CM

## 2016-09-01 DIAGNOSIS — D608 Other acquired pure red cell aplasias: Secondary | ICD-10-CM

## 2016-09-01 DIAGNOSIS — D57219 Sickle-cell/Hb-C disease with crisis, unspecified: Secondary | ICD-10-CM

## 2016-09-01 LAB — CBC WITH DIFFERENTIAL/PLATELET
BASOS ABS: 0 10*3/uL (ref 0.0–0.1)
Basophils Relative: 0 %
EOS ABS: 0.2 10*3/uL (ref 0.0–0.7)
Eosinophils Relative: 2 %
HCT: 27.3 % — ABNORMAL LOW (ref 39.0–52.0)
HEMOGLOBIN: 9.9 g/dL — AB (ref 13.0–17.0)
LYMPHS ABS: 1.2 10*3/uL (ref 0.7–4.0)
LYMPHS PCT: 14 %
MCH: 25.5 pg — AB (ref 26.0–34.0)
MCHC: 36.3 g/dL — ABNORMAL HIGH (ref 30.0–36.0)
MCV: 70.4 fL — ABNORMAL LOW (ref 78.0–100.0)
Monocytes Absolute: 0.9 10*3/uL (ref 0.1–1.0)
Monocytes Relative: 11 %
NEUTROS PCT: 74 %
Neutro Abs: 6.3 10*3/uL (ref 1.7–7.7)
Platelets: 95 10*3/uL — ABNORMAL LOW (ref 150–400)
RBC: 3.88 MIL/uL — AB (ref 4.22–5.81)
RDW: 16.6 % — ABNORMAL HIGH (ref 11.5–15.5)
WBC: 8.6 10*3/uL (ref 4.0–10.5)

## 2016-09-01 LAB — RETICULOCYTES
RBC.: 3.88 MIL/uL — ABNORMAL LOW (ref 4.22–5.81)
RETIC COUNT ABSOLUTE: 58.2 10*3/uL (ref 19.0–186.0)
RETIC CT PCT: 1.5 % (ref 0.4–3.1)

## 2016-09-01 LAB — HEMOGLOBIN AND HEMATOCRIT, BLOOD
HCT: 27.7 % — ABNORMAL LOW (ref 39.0–52.0)
Hemoglobin: 9.8 g/dL — ABNORMAL LOW (ref 13.0–17.0)

## 2016-09-01 MED ORDER — AMOXICILLIN-POT CLAVULANATE 875-125 MG PO TABS: 1.0000 | ORAL_TABLET | Freq: Two times a day (BID) | ORAL | 0 refills | Status: DC

## 2016-09-01 MED ORDER — FOLIC ACID 1 MG PO TABS: 1.0000 mg | ORAL_TABLET | Freq: Every day | ORAL | 11 refills | Status: AC

## 2016-09-01 MED ORDER — MORPHINE SULFATE 15 MG PO TABS: 15.0000 mg | ORAL_TABLET | ORAL | 0 refills | Status: AC | PRN

## 2016-09-01 NOTE — Discharge Summary (Signed)
Justin Solis MRN: [id number] DOB/AGE: Jun 07, 1991 25 y.o.  Admit date: 08/28/2016 Discharge date: 09/01/2016  Primary Care Physician:  No PCP Per Patient   Discharge Diagnoses:   Patient Active Problem List   Diagnosis Date Noted  . Sickle cell anemia with pain (HCC) 08/29/2016  . Sickle cell pain crisis (HCC) 08/28/2016    DISCHARGE MEDICATION:   Medication List    STOP taking these medications   HYDROcodone-acetaminophen 5-325 MG tablet Commonly known as:  NORCO/VICODIN   indomethacin 25 MG capsule Commonly known as:  INDOCIN     TAKE these medications   amoxicillin-clavulanate 875-125 MG tablet Commonly known as:  AUGMENTIN Take 1 tablet by mouth 2 (two) times daily.   folic acid 1 MG tablet Commonly known as:  FOLVITE Take 1 tablet (1 mg total) by mouth daily. Start taking on:  09/02/2016   morphine 15 MG tablet Commonly known as:  MSIR Take 1 tablet (15 mg total) by mouth every 4 (four) hours as needed for moderate pain.         Consults: Treatment Team:  M[facility], MD   SIGNIFICANT DIAGNOSTIC STUDIES:  Ct Abdomen Pelvis W Wo Contrast  Result Date: 08/30/2016 CLINICAL DATA:  Left upper quadrant abdominal pain and tenderness. EXAM: CT ABDOMEN AND PELVIS WITHOUT AND WITH CONTRAST TECHNIQUE: Multidetector CT imaging of the abdomen and pelvis was performed following the standard protocol before and following the bolus administration of intravenous contrast. CONTRAST:  ISOVUE-300 IOPAMIDOL (ISOVUE-300) INJECTION 61% COMPARISON:  08/09/2016 chest CT FINDINGS: Lower chest: Trace bilateral pleural effusions. Low-density precontrast blood pool favoring anemia. Hepatobiliary: Contracted gallbladder.  Otherwise unremarkable. Pancreas: Unremarkable Spleen: Spleen measures proximally 11.0 by 7.5 by 19.1 cm (volume = 830 cm^3), with a large region of abnormal splenic hypoenhancement inferiorly favoring splenic sequestration, and irregularity at the interface  between the hypoenhancing abnormally enhancing spleen. This hypoenhancing portion is about half of the splenic volume. Adrenals/Urinary Tract: Unremarkable Stomach/Bowel: Unremarkable Vascular/Lymphatic: Unremarkable Reproductive: Unremarkable Other: Small amount of pelvic ascites. Small amount of perisplenic and perihepatic ascites. Musculoskeletal: Unremarkable IMPRESSION: 1. 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 There is a small amount of perisplenic and perihepatic ascites as well as a small amount of pelvic ascites. 2. Small bilateral pleural effusions. 3. Low-density blood pool favoring anemia. These results were called by telephone at the time of interpretation on 08/30/2016 at 4:36 pm to Dr. Marcelino Duster Calvin Jablonowski , who verbally acknowledged these results. Electronically Signed   By: Gaylyn Rong M.D.   On: 08/30/2016 16:38   Dg Chest 2 View  Result Date: 08/31/2016 CLINICAL DATA:  Sickle cell anemia with crisis EXAM: CHEST  2 VIEW COMPARISON:  08/30/2016 FINDINGS: The heart size and mediastinal contours are within normal limits. Both lungs are clear. The visualized skeletal structures are unremarkable. IMPRESSION: No active cardiopulmonary disease. Electronically Signed   By: Signa Kell M.D.   On: 08/31/2016 09:52   Dg Chest 2 View  Result Date: 08/09/2016 CLINICAL DATA:  Chest pain and shortness of breath. Sickle cell crisis. EXAM: CHEST  2 VIEW COMPARISON:  None. FINDINGS: The lungs are clear. The heart size and pulmonary vascularity are normal. No adenopathy. No bone lesions evident. IMPRESSION: No abnormality noted radiographically. Electronically Signed   By: Bretta Bang III M.D.   On: 08/09/2016 09:19   Ct Head Wo Contrast  Result Date: 08/28/2016 CLINICAL DATA:  25 y/o M; severe and changing right-sided  headache for 1 hour. EXAM: CT HEAD WITHOUT CONTRAST TECHNIQUE:  Contiguous axial images were obtained from the base of the skull through the vertex without intravenous contrast. COMPARISON:  None. FINDINGS: Brain: No evidence of acute infarction, hemorrhage, hydrocephalus, extra-axial collection or mass lesion/mass effect. Vascular: No hyperdense vessel or unexpected calcification. Skull: Normal. Negative for fracture or focal lesion. Sinuses/Orbits: Right sphenoid sinus mucous retention cyst. Otherwise negative. Other: None. IMPRESSION: No acute intracranial process identified.  Normal CT of the head. Electronically Signed   By: Mitzi Hansen M.D.   On: 08/28/2016 04:52   Ct Angio Chest Pe W Or Wo Contrast  Result Date: 08/09/2016 CLINICAL DATA:  25 year old with sickle cell disease presenting with acute onset of chest pain which began last night. Elevated D-dimer. EXAM: CT ANGIOGRAPHY CHEST WITH CONTRAST TECHNIQUE: Multidetector CT imaging of the chest was performed using the standard protocol during bolus administration of intravenous contrast. Multiplanar CT image reconstructions and MIPs were obtained to evaluate the vascular anatomy. CONTRAST:  100 mL Isovue 370 IV. COMPARISON:  None. FINDINGS: Technical quality:  Excellent. Pulmonary emboli:  Absent. Cardiovascular: Upper normal heart size. No visible coronary atherosclerosis. No visible atherosclerosis involving thoracic or upper abdominal aorta or their visualized branches. No pericardial effusion. Mediastinum/Nodes: No pathologically enlarged mediastinal, hilar or axillary lymph nodes. No mediastinal masses. Normal-appearing esophagus. Residual thymic tissue in the anterior superior mediastinum. Visualized thyroid gland unremarkable. Lungs/Pleura: Pulmonary parenchyma clear without localized airspace consolidation, interstitial disease, or parenchymal nodules or masses. Central airways patent without significant bronchial wall thickening. No pleural effusions. No pleural plaques or masses. Upper Abdomen:  Unremarkable for the very early arterial phase of enhancement. Musculoskeletal: Regional skeleton intact without acute or significant osseous abnormality. Review of the MIP images confirms the above findings. IMPRESSION: 1. No evidence of pulmonary embolism. 2.  No acute cardiopulmonary disease. Electronically Signed   By: Hulan Saas M.D.   On: 08/09/2016 12:44   Dg Chest Port 1 View  Result Date: 08/30/2016 CLINICAL DATA:  Fever. EXAM: PORTABLE CHEST 1 VIEW COMPARISON:  Radiographs of August 09, 2016. FINDINGS: The heart size and mediastinal contours are within normal limits. Both lungs are clear. No pneumothorax or pleural effusion is noted. The visualized skeletal structures are unremarkable. IMPRESSION: No acute cardiopulmonary abnormality seen. Electronically Signed   By: Lupita Raider, M.D.   On: 08/30/2016 20:11   Dg Shoulder Left  Result Date: 08/28/2016 CLINICAL DATA:  Acute onset of left arm and shoulder pain. Initial encounter. EXAM: LEFT SHOULDER - 2+ VIEW COMPARISON:  None. FINDINGS: There is no evidence of fracture or dislocation. The left humeral head is seated within the glenoid fossa. The acromioclavicular joint is unremarkable in appearance. There is no evidence of sclerosis to suggest sequelae of the patient's sickle cell disease. No significant soft tissue abnormalities are seen. The visualized portions of the left lung are clear. IMPRESSION: No evidence of fracture or dislocation. The left glenohumeral joint is unremarkable on radiograph. Electronically Signed   By: Roanna Raider M.D.   On: 08/28/2016 04:34       Recent Results (from the past 240 hour(s))  MRSA PCR Screening     Status: None   Collection Time: 08/30/16  5:41 PM  Result Value Ref Range Status   MRSA by PCR NEGATIVE NEGATIVE Final    Comment:        The GeneXpert MRSA Assay (FDA approved for NASAL specimens only), is one component of a comprehensive MRSA colonization surveillance program.  It  is not intended to diagnose MRSA infection nor to guide or monitor treatment for MRSA infections.   Culture, blood (Routine X 2) w Reflex to ID Panel     Status: None (Preliminary result)   Collection Time: 08/30/16  6:30 PM  Result Value Ref Range Status   Specimen Description BLOOD LEFT ARM  Final   Special Requests BOTTLES DRAWN AEROBIC AND ANAEROBIC 10CC  Final   Culture   Final    NO GROWTH < 24 HOURS Performed at [facility]    Report Status PENDING  Incomplete  Culture, blood (Routine X 2) w Reflex to ID Panel     Status: None (Preliminary result)   Collection Time: 08/30/16  6:46 PM  Result Value Ref Range Status   Specimen Description BLOOD RIGHT HAND  Final   Special Requests BOTTLES DRAWN AEROBIC AND ANAEROBIC 10CC  Final   Culture   Final    NO GROWTH < 24 HOURS Performed at [facility]    Report Status PENDING  Incomplete    BRIEF ADMITTING H & P: Che-Okee Fatzinger  is a 25 y.o. male who recently relocated to [facility], has been to the ED 3 x in 3 weeks for pain especially in his left arm and shoulder joint radiating to his chest and back attributed to his "sickle cell disease". He was managed on each occasion with IV dilaudid and discharged on percocet but there was no sustained relief. He presented today again with the same complain and after rounds of IV dilaudid, pain remained above 5 and the plan is to admit for pain control. We do not have access to his previous history of sickle cell and its management, patient said he was pain free for the past 7 years until recently. He said his parents both have sickle cell disease and that his father died of complications of SCD. He however also said he is the only one with sickle cell disease out of 5 siblings. He denies any fever, no trauma or injury, no cough or SOB, no urinary symptom. He is not on any medication for SCD but takes Roxicodone prescribed from the EDs. He does not have PCP.   CBCD showed low  platelets and +target cells and sickle cell Left shoulder X-Ray: No abnormality CT Head: No acute abnormality   Hospital Course:  Present on Admission: Pt was admitted with sickle cell crisis in the setting of an unknown genotype. His pain was localized to the LUE and minimally in the left flank.  He is opiate naive and was initially treated by the admitting Physician with MS Contin and intermittent PRN doses of IV Morphine Sulfate. Subsequently his pain worsened and the Morphine was discontinued and he was transitioned to Dilaudid via PCA. As the pain improved, he was transitioned to oral Morphine and he was discharged home on Morphine Sulfate  15 mg # 30 tabs to be used q 4 hours PRN.  The pain improved in the distribution of the LUE but worsened in the left flank area. 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 He was transferred to the SDU and received aggressive hydration and was Hb/Hct was evaluated serially. He had a continued drop in Hb and was transfused 1 unit RBC's to about 1/2 way to his normal Hb which was established from records (from Dr. Mervyn Gay) to be  12-13. At the time of discharge   Pt also developed a fever and was started on Zosyn for a suspicion of pneumonia vs. Acute chest syndrome as CXR showed and opacity and CT abdomen did show pleural effusions. Pt was continued on Zosyn and remained afebrile throughout the remainder of hospital course. He was discharged home on Augmentin 875 mg to complete a 7 day course. At the time of discharge, his oxygen saturations were 99% on RA with ambulation and his Hb was 9.8 g/dL and starting to increase from a nadir of 9.2 g/dL. Pt was pain free in both the LUE and left upper quadrant and splenomegaly was decreasing by clinical examination. I advised patietn of the importance of completing antibiotic. He does  not have a Primary Care Physician and an appointment has been set up for [facility] to [facility] Tuesday to have labs checked and [facility] a follow up appointment will be arranged for him for the following Thursday.   . Sickle cell anemia with pain (HCC) . Splenic Sequestration:  . Fever:  .  Hypoxemia: . Constipation:   Disposition and Follow-up:  Pt is discharged home in good condition. [facility] Tuesday 09/06/16 to have labs drawn. Per Dr. Hyman HopesJegede, he will arrange an appointment to be seen in the clinic on 09/08/2016. Girlfriend in the room and aware of all discharge instructions. Pt advised to call the hospital or come back if any fevers, SOB, Lightheadedness.  DISCHARGE EXAM:  General: Alert, awake, oriented x3, in no apparent distress. Well appearing. HEENT: [place]/AT PEERL, EOMI, anicteric Neck: Trachea midline, no masses, no thyromegal,y no JVD, no carotid bruit OROPHARYNX: Moist, No exudate/ erythema/lesions.  Heart: Regular rate and rhythm, without murmurs, rubs, gallops or S3. PMI non-displaced. Exam reveals no decreased pulses. Pulmonary/Chest: Normal effort. Breath sounds normal. No. Apnea. Clear to auscultation,no stridor,  no wheezing and no rhonchi noted. No respiratory distress and no tenderness noted. Abdomen: Soft, nontender, nondistended, normal bowel sounds, no masses no hepatosplenomegaly noted. Specifically no dullness to percussion noted in traub's space.  No fluid wave and no ascites. There is no guarding or rebound. Neuro: Alert and oriented to person, place and time. Normal motor skills, Displays no atrophy or tremors and exhibits normal muscle tone.  No focal neurological deficits noted cranial nerves II through XII grossly intact. No sensory deficit noted. Strength at baseline in bilateral upper and lower extremities. Gait normal. Musculoskeletal: No warmth swelling or erythema around joints, no spinal  tenderness noted. Psychiatric: Patient alert and oriented x3, good insight and cognition, good recent to remote recall. Mood, affect and judgement normal Lymph node survey: No cervical axillary or inguinal lymphadenopathy noted. Skin: Skin is warm and dry. No bruising, no ecchymosis and no rash noted. Pt is not diaphoretic. No erythema. No pallor    Blood pressure 118/75, pulse 94, temperature 98.1 F (36.7 C), temperature source Oral, resp. rate 18, height 6\' 2"  (1.88 m), weight 80.1 kg (176 lb 8 oz), SpO2 100 %.   Recent Labs  08/30/16 1715  NA 138  K 3.9  CL 105  CO2 30  GLUCOSE 112*  BUN 8  CREATININE 0.99  CALCIUM 9.1    Recent Labs  08/30/16 1715  AST 25  ALT 15*  ALKPHOS 50  BILITOT 1.3*  PROT 6.8  ALBUMIN 4.0   No results for input(s): LIPASE, AMYLASE in the last 72  hours.  Recent Labs  08/31/16 0820 08/31/16 1534 09/01/16 0429  WBC 9.5  --  8.6  NEUTROABS 7.0  --  6.3  HGB 9.2*  9.2* 9.6* 9.9*  9.8*  HCT 25.4*  25.7* 27.1* 27.3*  27.7*  MCV 70.2*  --  70.4*  PLT 65*  --  95*     Total time spent including face to face and decision making was greater than 30 minutes  Signed: Halen Mossbarger A. 09/01/2016, 2:46 PM

## 2016-09-01 NOTE — Progress Notes (Signed)
Nursing Discharge Summary  Patient ID: Justin Solis MRN: [id number] DOB/AGE: 1991-05-03 25 y.o.  Admit date: 08/28/2016 Discharge date: 09/01/2016  Discharged Condition: good  Disposition: 01-Home or Self Care  Follow-up Information    [facility] Follow up on 10/25/2016.   Why:  appointment at 9am. Contact information: [address] [place] [phone]       [facility] Follow up on 09/06/2016.   Specialty:  Internal Medicine Why:  Walk in between 8am-11am to have labs drawn (CBC with diff) Contact information: [address] [place] [id number] [phone]          Prescriptions Given: Prescriptions for MSIR, Augmentin, and Folic Acid.  Medications and follow up appointments discussed.  Patient and significant other verbalized understanding without further questions.   Means of Discharge: Patient to be transported downstairs via wheelchair to be discharge home via private vehicle.    Signed: Gloriajean DellBaldwin, Tasmine Hipwell Danielle 09/01/2016, 3:50 PM

## 2016-09-01 NOTE — Progress Notes (Signed)
Patient ambulated the entirety of the unit maintaining 99-100% on room air.  Patient ambulated without difficulty.

## 2016-09-01 NOTE — Progress Notes (Signed)
Pt requested additional laxative this shift, agreeable to try mag citrate that was mentioned as a possibility in MD's progress note. Paged NP and received order for this, given to pt at 2300. Pt reports he has had good results. Continue to monitor. Mick SellShannon Coston Mandato RN.

## 2016-09-02 ENCOUNTER — Other Ambulatory Visit: Payer: Self-pay | Admitting: Internal Medicine

## 2016-09-02 DIAGNOSIS — D57212 Sickle-cell/Hb-C disease with splenic sequestration: Secondary | ICD-10-CM

## 2016-09-03 LAB — TYPE AND SCREEN
ABO/RH(D): O POS
Antibody Screen: NEGATIVE
DONOR AG TYPE: NEGATIVE
DONOR AG TYPE: NEGATIVE
Donor AG Type: NEGATIVE
UNIT DIVISION: 0
Unit division: 0
Unit division: 0

## 2016-09-04 ENCOUNTER — Encounter (HOSPITAL_COMMUNITY): Payer: Self-pay | Admitting: Emergency Medicine

## 2016-09-04 ENCOUNTER — Inpatient Hospital Stay (HOSPITAL_COMMUNITY)
Admission: EM | Admit: 2016-09-04 | Discharge: 2016-09-07 | DRG: 193 | Disposition: A | Discharge status: Home / Self Care | Payer: Self-pay | Attending: Internal Medicine | Admitting: Internal Medicine

## 2016-09-04 ENCOUNTER — Emergency Department (HOSPITAL_COMMUNITY): Payer: Self-pay

## 2016-09-04 DIAGNOSIS — D5700 Hb-SS disease with crisis, unspecified: Secondary | ICD-10-CM | POA: Diagnosis present

## 2016-09-04 DIAGNOSIS — J129 Viral pneumonia, unspecified: Principal | ICD-10-CM | POA: Diagnosis present

## 2016-09-04 DIAGNOSIS — D638 Anemia in other chronic diseases classified elsewhere: Secondary | ICD-10-CM | POA: Diagnosis present

## 2016-09-04 DIAGNOSIS — K5900 Constipation, unspecified: Secondary | ICD-10-CM | POA: Diagnosis present

## 2016-09-04 DIAGNOSIS — J189 Pneumonia, unspecified organism: Secondary | ICD-10-CM | POA: Diagnosis present

## 2016-09-04 DIAGNOSIS — R079 Chest pain, unspecified: Secondary | ICD-10-CM

## 2016-09-04 DIAGNOSIS — R1012 Left upper quadrant pain: Secondary | ICD-10-CM | POA: Diagnosis present

## 2016-09-04 LAB — CULTURE, BLOOD (ROUTINE X 2)
CULTURE: NO GROWTH
Culture: NO GROWTH

## 2016-09-04 MED ORDER — HYDROMORPHONE HCL 1 MG/ML IJ SOLN
1.0000 mg | INTRAMUSCULAR | Status: DC | PRN
  Administered 2016-09-05: 1 mg via INTRAVENOUS
  Filled 2016-09-04: qty 1

## 2016-09-04 MED ORDER — SODIUM CHLORIDE 0.9 % IV BOLUS (SEPSIS)
1000.0000 mL | Freq: Once | INTRAVENOUS | Status: AC
  Administered 2016-09-04: 1000 mL via INTRAVENOUS

## 2016-09-04 MED ORDER — KETOROLAC TROMETHAMINE 30 MG/ML IJ SOLN
30.0000 mg | Freq: Once | INTRAMUSCULAR | Status: AC
  Administered 2016-09-05: 30 mg via INTRAVENOUS
  Filled 2016-09-04: qty 1

## 2016-09-04 NOTE — ED Triage Notes (Signed)
Brought in by EMS from home with c/o sickle cell pain crisis.  Pt reports progressive pain to left flank area with nausea and vomiting.   Pt was given Zofran 4 mg IV,  Morphine Sulfate  4 mg IV and Tylenol 1000 mg po en route.

## 2016-09-04 NOTE — ED Provider Notes (Signed)
Emergency Department Provider Note  By signing my name below, I, Justin Solis, attest that this documentation has been prepared under the direction and in the presence of Justin PlanJoshua G Long, MD. Electronically Signed: Angelene GiovanniEmmanuella Solis, ED Scribe. 09/04/16. 11:48 PM.   Justin PlanJoshua G Long, MD has reviewed the triage vital signs and the nursing notes.   HISTORY  Chief Complaint Sickle Cell Pain Crisis   HPI Comments: [name] is a 25 y.o. male with a hx of sickle cell anemia who presents to the Emergency Department complaining of gradually worsening persistent severe left arm pain (concentrated to his left elbow) and LUQ pain consistent with his sickle cell pain crisis onset 08/28/16. He reports associated nausea, vomiting, and fever. He notes that the LUQ pain is worse with deep breathing. Family member reports that pt had a nosebleed after taking his medication PTA and began to vomit. Pt was seen on 08/28/16 for these symptoms where he was admitted. He states that he required a blood transfusion during the admission. Pt has NKDA. He denies any diarrhea, BLE pain, numbness/tingling in BLE, weakness, or any other symptoms.   Past Medical History:  Diagnosis Date  . Sickle cell anemia [facility](HCC)     Patient Active Problem List   Diagnosis Date Noted  . PNA (pneumonia) 09/05/2016  . Pneumonia 09/05/2016  . Sickle cell anemia with pain (HCC) 08/29/2016  . Sickle cell pain crisis (HCC) 08/28/2016    History reviewed. No pertinent surgical history.    Allergies Review of patient's allergies indicates no known allergies.  Family History  Problem Relation Age of Onset  . Sickle cell anemia Mother   . Sickle cell anemia Father     Social History Social History  Substance Use Topics  . Smoking status: Current Some Day Smoker  . Smokeless tobacco: Never Used  . Alcohol use No    Review of Systems Constitutional: Fever. No chills Eyes: No visual changes. ENT: No sore  throat. Cardiovascular: Denies chest pain. Respiratory: Denies shortness of breath. Gastrointestinal: No abdominal pain. Nausea and vomiting.  No diarrhea.  No constipation. Genitourinary: Negative for dysuria. Musculoskeletal: Negative for back pain. Skin: Negative for rash. Neurological: Negative for headaches, focal weakness or numbness.  10-point ROS otherwise negative.  ____________________________________________   PHYSICAL EXAM:  VITAL SIGNS: ED Triage Vitals [09/04/16 2321]  Enc Vitals Group     BP 122/88     Pulse Rate 95     Resp 22     Temp 99.9 F (37.7 C)     Temp Source Oral     SpO2 99 %     Pain Score 10   Constitutional: Alert and oriented. Appears moderately uncomfortable.  Eyes: Conjunctivae are normal.  Head: Atraumatic. Nose: No congestion/rhinnorhea. Mouth/Throat: Mucous membranes are somewhat dry. Oropharynx non-erythematous. Neck: No stridor.   Cardiovascular: Normal rate, regular rhythm. Good peripheral circulation. Grossly normal heart sounds.   Respiratory: Normal respiratory effort.  No retractions. Lungs CTAB. Gastrointestinal: Soft and nontender. No distention.  Musculoskeletal: No lower extremity tenderness nor edema. No gross deformities of extremities. Neurologic:  Normal speech and language. No gross focal neurologic deficits are appreciated.  Skin:  Skin is warm, dry and intact. No rash noted. Psychiatric: Mood and affect are normal. Speech and behavior are normal.  ____________________________________________  LABS (all labs ordered are listed, but only abnormal results are displayed)  Labs Reviewed  COMPREHENSIVE METABOLIC PANEL - Abnormal; Notable for the following:  Result Value   Glucose, Bld 107 (*)    All other components within normal limits  CBC WITH DIFFERENTIAL/PLATELET - Abnormal; Notable for the following:    Hemoglobin 11.2 (*)    HCT 31.2 (*)    MCV 67.8 (*)    MCH 24.3 (*)    RDW 17.4 (*)    Neutro Abs  7.9 (*)    Monocytes Absolute 1.1 (*)    All other components within normal limits  LACTATE DEHYDROGENASE - Abnormal; Notable for the following:    LDH 311 (*)    All other components within normal limits  BASIC METABOLIC PANEL - Abnormal; Notable for the following:    Calcium 8.8 (*)    All other components within normal limits  HEPATIC FUNCTION PANEL - Abnormal; Notable for the following:    Bilirubin, Direct <0.1 (*)    All other components within normal limits  CBC WITH DIFFERENTIAL/PLATELET - Abnormal; Notable for the following:    WBC 11.0 (*)    RBC 4.11 (*)    Hemoglobin 10.1 (*)    HCT 28.0 (*)    MCV 68.1 (*)    MCH 24.6 (*)    MCHC 36.1 (*)    RDW 17.5 (*)    Monocytes Absolute 1.7 (*)    All other components within normal limits  CULTURE, BLOOD (ROUTINE X 2)  CULTURE, BLOOD (ROUTINE X 2)  CULTURE, EXPECTORATED SPUTUM-ASSESSMENT  GRAM STAIN  LIPASE, BLOOD  RETICULOCYTES  HIV ANTIBODY (ROUTINE TESTING)  STREP PNEUMONIAE URINARY ANTIGEN  TROPONIN I  LEGIONELLA PNEUMOPHILA SEROGP 1 UR AG  I-STAT TROPOININ, ED  TYPE AND SCREEN   ____________________________________________  EKG   EKG Interpretation  Date/Time:  Monday September 05 2016 00:01:49 EDT Ventricular Rate:  89 PR Interval:    QRS Duration: 98 QT Interval:  342 QTC Calculation: 417 R Axis:   78 Text Interpretation:  Sinus rhythm RSR' in V1 or V2, probably normal variant Inferior infarct, acute (LCx) Borderline ST elevation, anterior leads Lateral leads are also involved Concerning ST segment changes inferiorly. No STEMI but Solis to repeat with artifact.  Confirmed by LONG MD, Justin [phone]) on 09/05/2016 12:13:07 AM      Repeat EKG evaluated with improved artifact and no evidence of acute ischemia.  ____________________________________________  RADIOLOGY  Dg Chest 2 View  Result Date: 09/05/2016 CLINICAL DATA:  25 year old male, history of sickle cell anemia. Severe left arm pain pain to left  flank. EXAM: CHEST  2 VIEW COMPARISON:  08/31/2016 FINDINGS: The right lung is clear. Interval consolidation at the left lung base. Development of small left effusion. Heart size is stable. No pneumothorax. IMPRESSION: Interim development of small left-sided pleural effusion and left basilar consolidation. Electronically Signed   By: Jasmine Pang M.D.   On: 09/05/2016 01:52   Dg Abd 1 View  Result Date: 09/05/2016 CLINICAL DATA:  Abdominal pain and constipation. EXAM: ABDOMEN - 1 VIEW COMPARISON:  None. FINDINGS: No evidence of dilated bowel loops. Small moderate amount of stool is noted in the right colon. No radio-opaque calculi or other significant radiographic abnormality are seen. IMPRESSION: Unremarkable bowel gas pattern.  No acute findings. Electronically Signed   By: Myles Rosenthal M.D.   On: 09/05/2016 08:37   Ct Angio Chest Pe W Or Wo Contrast  Result Date: 09/05/2016 CLINICAL DATA:  25 year old male with history of sickle cell anemia recently admitted for sickle cell pain crisis presenting with worsening left-sided chest pain, fever and chills. Hematemesis  and hemoptysis just prior to admission. EXAM: CT ANGIOGRAPHY CHEST WITH CONTRAST TECHNIQUE: Multidetector CT imaging of the chest was performed using the standard protocol during bolus administration of intravenous contrast. Multiplanar CT image reconstructions and MIPs were obtained to evaluate the vascular anatomy. CONTRAST:  100 mL of Isovue 370. COMPARISON:  No priors. FINDINGS: Cardiovascular: No filling defects within the pulmonary arterial tree to suggest underlying pulmonary embolism. Heart size is normal. There is no significant pericardial fluid, thickening or pericardial calcification. No atherosclerotic disease of the thoracic aorta. Mediastinum/Nodes: No pathologically enlarged mediastinal or hilar lymph nodes. Esophagus is unremarkable in appearance. No axillary lymphadenopathy. Lungs/Pleura: Large area of airspace consolidation  in the posterior aspect of the left lower lobe. Minimal subsegmental atelectasis in the right lower lobe. No suspicious pulmonary nodules or masses. No pleural effusions. Upper Abdomen: Spleen is incompletely visualized, but appears massively enlarged measuring 13.9 x 9.3 cm. Musculoskeletal: There are no aggressive appearing lytic or blastic lesions noted in the visualized portions of the skeleton. Review of the MIP images confirms the above findings. IMPRESSION: 1. No evidence of pulmonary embolism. 2. Left lower lobe airspace consolidation, compatible with pneumonia. 3. Splenomegaly. Electronically Signed   By: Trudie Reed M.D.   On: 09/05/2016 09:00    ____________________________________________   PROCEDURES  Procedure(s) performed:   Procedures  None ____________________________________________   INITIAL IMPRESSION / ASSESSMENT AND Solis / ED COURSE  Pertinent labs & imaging results that were available during my care of the patient were reviewed by me and considered in my medical decision making (see chart for details).  Patient presents to the emergency department for evaluation of left upper quadrant pain worse with inspiration. He was recently admitted for sickle cell pain crisis and fever. His CT scan at that time showed concern for area of splenic sequestration. He is been compliant with his home pain medications and antibiotics. He received a blood transfusion during the last admission. On my exam he appears uncomfortable's complaining mainly of pain in his left upper quadrant and left arm. No rebound or guarding on my abdominal exam. Solis for chest x-ray and repeat blood counts with reticulocytes. We'll give IV fluids. Patient is currently on oxygen for comfort. Continue to follow closely and reassess.   03:08 AM Discussed case with hospitalist. He has an infiltrate on the left side. Concern for possible early acute chest. No hypoxemia. Started antibiotics for CAP but patient  did have recent hospitalization. Will defer HCAP coverage decision to hospitalist team. Blood cultures ordered. Patient and family at bedside updated.   Reviewed EKG, labs, and imaging. Placed temporary admission orders after discussion with hospitalist.  ____________________________________________  FINAL CLINICAL IMPRESSION(S) / ED DIAGNOSES  Final diagnoses:  Chest pain  Constipation     MEDICATIONS GIVEN DURING THIS VISIT:  Medications  folic acid (FOLVITE) tablet 1 mg (1 mg Oral Given 09/05/16 1028)  senna-docusate (Senokot-S) tablet 1 tablet (1 tablet Oral Given 09/05/16 1028)  polyethylene glycol (MIRALAX / GLYCOLAX) packet 17 g (not administered)  naloxone (NARCAN) injection 0.4 mg (not administered)    And  sodium chloride flush (NS) 0.9 % injection 9 mL (not administered)  ondansetron (ZOFRAN) injection 4 mg (not administered)  0.45 % sodium chloride infusion ( Intravenous New Bag/Given 09/05/16 0522)  ketorolac (TORADOL) 15 MG/ML injection 15 mg (15 mg Intravenous Given 09/05/16 1120)  HYDROmorphone (DILAUDID) 1 mg/mL PCA injection (0.9 mg Intravenous Received 09/05/16 1122)  ceFEPIme (MAXIPIME) 1 g in dextrose 5 % 50 mL IVPB (  1 g Intravenous Given 09/05/16 1418)  vancomycin (VANCOCIN) IVPB 1000 mg/200 mL premix (1,000 mg Intravenous Given 09/05/16 1202)  diphenhydrAMINE (BENADRYL) capsule 25-50 mg (25 mg Oral Given 09/05/16 1202)  sodium chloride 0.9 % bolus 1,000 mL (0 mLs Intravenous Stopped 09/05/16 0101)  ketorolac (TORADOL) 30 MG/ML injection 30 mg (30 mg Intravenous Given 09/05/16 0001)  cefTRIAXone (ROCEPHIN) 1 g in dextrose 5 % 50 mL IVPB (0 g Intravenous Stopped 09/05/16 0329)  azithromycin (ZITHROMAX) 500 mg in dextrose 5 % 250 mL IVPB (500 mg Intravenous Transfusing/Transfer 09/05/16 0333)  iopamidol (ISOVUE-370) 76 % injection 100 mL (100 mLs Intravenous Contrast Given 09/05/16 0837)     NEW OUTPATIENT MEDICATIONS STARTED DURING THIS VISIT:  None  I  personally performed the services described in this documentation, which was scribed in my presence. The recorded information has been reviewed and is accurate.   Note:  This document was prepared using Dragon voice recognition software and may include unintentional dictation errors.  Alona Bene, MD Emergency Medicine   Justin Plan, MD 09/05/16 [phone]

## 2016-09-04 NOTE — ED Notes (Signed)
Bed: [id number] Expected date:  Expected time:  Means of arrival:  Comments: 25yo M sickle cell

## 2016-09-05 ENCOUNTER — Inpatient Hospital Stay (HOSPITAL_COMMUNITY): Payer: Self-pay

## 2016-09-05 ENCOUNTER — Encounter (HOSPITAL_COMMUNITY): Payer: Self-pay | Admitting: Internal Medicine

## 2016-09-05 DIAGNOSIS — J189 Pneumonia, unspecified organism: Secondary | ICD-10-CM | POA: Diagnosis present

## 2016-09-05 LAB — CBC WITH DIFFERENTIAL/PLATELET
BASOS ABS: 0 10*3/uL (ref 0.0–0.1)
BASOS PCT: 0 %
Basophils Absolute: 0 10*3/uL (ref 0.0–0.1)
Basophils Relative: 0 %
EOS ABS: 0.2 10*3/uL (ref 0.0–0.7)
Eosinophils Absolute: 0.1 10*3/uL (ref 0.0–0.7)
Eosinophils Relative: 1 %
Eosinophils Relative: 2 %
HCT: 28 % — ABNORMAL LOW (ref 39.0–52.0)
HEMATOCRIT: 31.2 % — AB (ref 39.0–52.0)
HEMOGLOBIN: 10.1 g/dL — AB (ref 13.0–17.0)
Hemoglobin: 11.2 g/dL — ABNORMAL LOW (ref 13.0–17.0)
LYMPHS ABS: 1.1 10*3/uL (ref 0.7–4.0)
LYMPHS PCT: 15 %
Lymphocytes Relative: 11 %
Lymphs Abs: 1.7 10*3/uL (ref 0.7–4.0)
MCH: 24.3 pg — ABNORMAL LOW (ref 26.0–34.0)
MCH: 24.6 pg — AB (ref 26.0–34.0)
MCHC: 35.9 g/dL (ref 30.0–36.0)
MCHC: 36.1 g/dL — ABNORMAL HIGH (ref 30.0–36.0)
MCV: 67.8 fL — ABNORMAL LOW (ref 78.0–100.0)
MCV: 68.1 fL — AB (ref 78.0–100.0)
MONO ABS: 1.1 10*3/uL — AB (ref 0.1–1.0)
MONOS PCT: 11 %
MONOS PCT: 15 %
Monocytes Absolute: 1.7 10*3/uL — ABNORMAL HIGH (ref 0.1–1.0)
NEUTROS ABS: 7.4 10*3/uL (ref 1.7–7.7)
NEUTROS PCT: 77 %
Neutro Abs: 7.9 10*3/uL — ABNORMAL HIGH (ref 1.7–7.7)
Neutrophils Relative %: 68 %
PLATELETS: 236 10*3/uL (ref 150–400)
Platelets: 200 10*3/uL (ref 150–400)
RBC: 4.11 MIL/uL — ABNORMAL LOW (ref 4.22–5.81)
RBC: 4.6 MIL/uL (ref 4.22–5.81)
RDW: 17.4 % — AB (ref 11.5–15.5)
RDW: 17.5 % — ABNORMAL HIGH (ref 11.5–15.5)
WBC: 10.2 10*3/uL (ref 4.0–10.5)
WBC: 11 10*3/uL — ABNORMAL HIGH (ref 4.0–10.5)

## 2016-09-05 LAB — RETICULOCYTES
RBC.: 4.6 MIL/uL (ref 4.22–5.81)
RETIC COUNT ABSOLUTE: 55.2 10*3/uL (ref 19.0–186.0)
Retic Ct Pct: 1.2 % (ref 0.4–3.1)

## 2016-09-05 LAB — COMPREHENSIVE METABOLIC PANEL
ALT: 38 U/L (ref 17–63)
ANION GAP: 7 (ref 5–15)
AST: 30 U/L (ref 15–41)
Albumin: 4.2 g/dL (ref 3.5–5.0)
Alkaline Phosphatase: 55 U/L (ref 38–126)
BUN: 12 mg/dL (ref 6–20)
CHLORIDE: 106 mmol/L (ref 101–111)
CO2: 25 mmol/L (ref 22–32)
CREATININE: 0.88 mg/dL (ref 0.61–1.24)
Calcium: 9.3 mg/dL (ref 8.9–10.3)
Glucose, Bld: 107 mg/dL — ABNORMAL HIGH (ref 65–99)
Potassium: 3.8 mmol/L (ref 3.5–5.1)
Sodium: 138 mmol/L (ref 135–145)
Total Bilirubin: 0.8 mg/dL (ref 0.3–1.2)
Total Protein: 7.6 g/dL (ref 6.5–8.1)

## 2016-09-05 LAB — TYPE AND SCREEN
ABO/RH(D): O POS
Antibody Screen: NEGATIVE

## 2016-09-05 LAB — HEPATIC FUNCTION PANEL
ALK PHOS: 50 U/L (ref 38–126)
ALT: 33 U/L (ref 17–63)
AST: 24 U/L (ref 15–41)
Albumin: 3.6 g/dL (ref 3.5–5.0)
Total Bilirubin: 0.7 mg/dL (ref 0.3–1.2)
Total Protein: 6.8 g/dL (ref 6.5–8.1)

## 2016-09-05 LAB — BASIC METABOLIC PANEL
ANION GAP: 6 (ref 5–15)
BUN: 11 mg/dL (ref 6–20)
CHLORIDE: 107 mmol/L (ref 101–111)
CO2: 26 mmol/L (ref 22–32)
Calcium: 8.8 mg/dL — ABNORMAL LOW (ref 8.9–10.3)
Creatinine, Ser: 0.69 mg/dL (ref 0.61–1.24)
GFR calc Af Amer: >60 mL/min (ref >60)
GLUCOSE: 94 mg/dL (ref 65–99)
POTASSIUM: 3.8 mmol/L (ref 3.5–5.1)
Sodium: 139 mmol/L (ref 135–145)

## 2016-09-05 LAB — I-STAT TROPONIN, ED: Troponin i, poc: 0 ng/mL (ref 0.00–0.08)

## 2016-09-05 LAB — TROPONIN I: Troponin I: <0.03 ng/mL (ref <0.03)

## 2016-09-05 LAB — HIV ANTIBODY (ROUTINE TESTING W REFLEX): HIV Screen 4th Generation wRfx: NONREACTIVE

## 2016-09-05 LAB — STREP PNEUMONIAE URINARY ANTIGEN: Strep Pneumo Urinary Antigen: NEGATIVE

## 2016-09-05 LAB — LIPASE, BLOOD: LIPASE: 12 U/L (ref 11–51)

## 2016-09-05 LAB — LACTATE DEHYDROGENASE: LDH: 311 U/L — AB (ref 98–192)

## 2016-09-05 MED ORDER — DEXTROSE 5 % IV SOLN
1.0000 g | Freq: Three times a day (TID) | INTRAVENOUS | Status: DC
  Administered 2016-09-05 – 2016-09-06 (×4): 1 g via INTRAVENOUS
  Filled 2016-09-05 (×5): qty 1

## 2016-09-05 MED ORDER — CEFTRIAXONE SODIUM 1 G IJ SOLR
1.0000 g | Freq: Once | INTRAMUSCULAR | Status: AC
  Administered 2016-09-05: 1 g via INTRAVENOUS
  Filled 2016-09-05: qty 10

## 2016-09-05 MED ORDER — KETOROLAC TROMETHAMINE 15 MG/ML IJ SOLN
15.0000 mg | Freq: Four times a day (QID) | INTRAMUSCULAR | Status: AC
  Administered 2016-09-05 – 2016-09-06 (×5): 15 mg via INTRAVENOUS
  Filled 2016-09-05 (×5): qty 1

## 2016-09-05 MED ORDER — DIPHENHYDRAMINE HCL 25 MG PO CAPS
25.0000 mg | ORAL_CAPSULE | Freq: Four times a day (QID) | ORAL | Status: DC | PRN
  Administered 2016-09-05 (×2): 25 mg via ORAL
  Filled 2016-09-05 (×2): qty 1

## 2016-09-05 MED ORDER — DEXTROSE 5 % IV SOLN
500.0000 mg | Freq: Once | INTRAVENOUS | Status: AC
  Administered 2016-09-05: 500 mg via INTRAVENOUS
  Filled 2016-09-05: qty 500

## 2016-09-05 MED ORDER — IOPAMIDOL (ISOVUE-370) INJECTION 76%
100.0000 mL | Freq: Once | INTRAVENOUS | Status: AC | PRN
  Administered 2016-09-05: 100 mL via INTRAVENOUS

## 2016-09-05 MED ORDER — SODIUM CHLORIDE 0.45 % IV SOLN
INTRAVENOUS | Status: AC
  Administered 2016-09-05 (×2): via INTRAVENOUS

## 2016-09-05 MED ORDER — AZITHROMYCIN 500 MG IV SOLR: 500.0000 mg | INTRAVENOUS | Status: DC

## 2016-09-05 MED ORDER — ONDANSETRON HCL 4 MG/2ML IJ SOLN: 4.0000 mg | Freq: Four times a day (QID) | INTRAMUSCULAR | Status: DC | PRN

## 2016-09-05 MED ORDER — DEXTROSE 5 % IV SOLN: 1.0000 g | INTRAVENOUS | Status: DC

## 2016-09-05 MED ORDER — SODIUM CHLORIDE 0.9% FLUSH
9.0000 mL | INTRAVENOUS | Status: DC | PRN
Start: 2016-09-05 — End: 2016-09-06

## 2016-09-05 MED ORDER — VANCOMYCIN HCL IN DEXTROSE 1-5 GM/200ML-% IV SOLN
1000.0000 mg | Freq: Three times a day (TID) | INTRAVENOUS | Status: DC
  Administered 2016-09-05 – 2016-09-06 (×4): 1000 mg via INTRAVENOUS
  Filled 2016-09-05 (×4): qty 200

## 2016-09-05 MED ORDER — FOLIC ACID 1 MG PO TABS
1.0000 mg | ORAL_TABLET | Freq: Every day | ORAL | Status: DC
  Administered 2016-09-05 – 2016-09-07 (×3): 1 mg via ORAL
  Filled 2016-09-05 (×3): qty 1

## 2016-09-05 MED ORDER — POLYETHYLENE GLYCOL 3350 17 G PO PACK
17.0000 g | PACK | Freq: Every day | ORAL | Status: DC | PRN
Start: 2016-09-05 — End: 2016-09-07
  Administered 2016-09-05: 17 g via ORAL
  Filled 2016-09-05: qty 1

## 2016-09-05 MED ORDER — NALOXONE HCL 0.4 MG/ML IJ SOLN: 0.4000 mg | INTRAMUSCULAR | Status: DC | PRN

## 2016-09-05 MED ORDER — SENNOSIDES-DOCUSATE SODIUM 8.6-50 MG PO TABS
1.0000 | ORAL_TABLET | Freq: Two times a day (BID) | ORAL | Status: DC
  Administered 2016-09-05 – 2016-09-07 (×5): 1 via ORAL
  Filled 2016-09-05 (×5): qty 1

## 2016-09-05 MED ORDER — ACETAMINOPHEN 325 MG PO TABS
650.0000 mg | ORAL_TABLET | ORAL | Status: DC | PRN
  Administered 2016-09-05 – 2016-09-06 (×2): 650 mg via ORAL
  Filled 2016-09-05 (×2): qty 2

## 2016-09-05 MED ORDER — HYDROMORPHONE 1 MG/ML IV SOLN
INTRAVENOUS | Status: DC
  Administered 2016-09-05: 1 mg via INTRAVENOUS
  Administered 2016-09-05 (×2): 0.6 mg via INTRAVENOUS
  Administered 2016-09-05: 1.1 mg via INTRAVENOUS
  Administered 2016-09-05 – 2016-09-06 (×2): 0.9 mg via INTRAVENOUS
  Administered 2016-09-06: 1.2 mg via INTRAVENOUS
  Administered 2016-09-06: 0.6 mg via INTRAVENOUS
  Filled 2016-09-05: qty 25

## 2016-09-05 NOTE — Progress Notes (Signed)
Patient arrived on the unit at approximately 0344. He is alert and verbally responsive x 4. Seen by Dr Toniann FailKakrakandy and admit orders pending. Complains of left flank pain and nausea.

## 2016-09-05 NOTE — Progress Notes (Signed)
SICKLE CELL SERVICE PROGRESS NOTE  Justin Solis ZOX:[id number] DOB: [birth year] DOA: 09/04/2016 PCP: No PCP Per Patient  Assessment/Plan: Principal Problem:   PNA (pneumonia) Active Problems:   Sickle cell pain crisis (HCC)   Pneumonia  1. Pneumonia: Pt was recently discharged without any signs or symptoms of Pneumonia but was treated empirically with Augmentin. He reports that he was compliant with Augmentin upon discharge. It is likely that he developed a viral pneumonia post discharge. Nevertheless, he has had no fevers since admission and is on Vancomycin and Cefepime. HIV test pending and will continue Oxygen supplementation. Continue pulmonary toiletry. 2. Hb SS with crisis: Pt having minimal pain. Will continue PCA through today as he reports that the oral analgesics have been in effective during crisis and he does not use analgesics when he is not hospitalized.  3. Anemia of Chronic Disease: Pt recently had Splenic Sequestration and at the time of discharge his Hb was at 9.9 g/dL. He was supposed to follow up to have labs re-checked tomorrow. From the labs resulted yesterday, It appears that the blood has re-entered the circulation additionally he has no further pain in the area of the spleen. 4. C/O early satiety: I suspect that this is psychological associated with fear. I have re-assured patient and will observe intake over the next 24 hours.    Code Status: Full Code Family Communication: N/A Disposition Plan: Not yet ready for discharge  Jalana Moore A.  Pager [phone]. If 7PM-7AM, please contact night-coverage.  09/05/2016, 10:41 AM  LOS: 0 days  Inerim History: Pt reports no further bleeding from the nose. He states that he has been unable to eat the normal portions of food and feels that he is getting full early.   Consultants:  None  Procedures:  None  Antibiotics:  Vancomycin 10/22 >>  Cefepime 10/22 >>   Objective: Vitals:   09/05/16 0402  09/05/16 0519 09/05/16 0807 09/05/16 1020  BP: 127/85   118/79  Pulse: 81   78  Resp: 18 14 16 12   Temp: 97.7 F (36.5 C)   98.1 F (36.7 C)  TempSrc: Oral   Oral  SpO2: 100% 96% 97% 98%   Weight change:   Intake/Output Summary (Last 24 hours) at 09/05/16 1041 Last data filed at 09/05/16 0800  Gross per 24 hour  Intake           1511.4 ml  Output              350 ml  Net           1161.4 ml    General: Alert, awake, oriented x3, in no acute distress.  HEENT: [place]/AT PEERL, EOMI, anicteric. Neck: Trachea midline,  no masses, no thyromegal,y no JVD, no carotid bruit OROPHARYNX:  Moist, No exudate/ erythema/lesions.  Heart: Regular rate and rhythm, without murmurs, rubs, gallops, PMI non-displaced, no heaves or thrills on palpation.  Lungs: Decreased BS at left bases. No wheezing or rhonchi noted. No increased vocal fremitus resonant to percussion  Abdomen: Soft, nontender, nondistended, positive bowel sounds, no masses no hepatosplenomegaly noted.  Neuro: No focal neurological deficits noted cranial nerves II through XII grossly intact.  Strength at functional baseline in bilateral upper and lower extremities. Musculoskeletal: No warmth swelling or erythema around joints, no spinal tenderness noted. Psychiatric: Patient alert and oriented x3, good insight and cognition, good recent to remote recall.   Data Reviewed: Basic Metabolic Panel:  Recent Labs Lab 08/30/16 1715 09/04/16 2335 09/05/16 8295  NA 138 138 139  K 3.9 3.8 3.8  CL 105 106 107  CO2 30 25 26   GLUCOSE 112* 107* 94  BUN 8 12 11   CREATININE 0.99 0.88 0.69  CALCIUM 9.1 9.3 8.8*   Liver Function Tests:  Recent Labs Lab 08/30/16 1715 09/04/16 2335 09/05/16 0643  AST 25 30 24   ALT 15* 38 33  ALKPHOS 50 55 50  BILITOT 1.3* 0.8 0.7  PROT 6.8 7.6 6.8  ALBUMIN 4.0 4.2 3.6    Recent Labs Lab 09/04/16 2335  LIPASE 12   No results for input(s): AMMONIA in the last 168 hours. CBC:  Recent Labs Lab  08/30/16 1213  08/31/16 0820 08/31/16 1534 09/01/16 0429 09/04/16 2335 09/05/16 0643  WBC 10.9*  --  9.5  --  8.6 10.2 11.0*  NEUTROABS 8.5*  --  7.0  --  6.3 7.9* 7.4  HGB 9.8*  < > 9.2*  9.2* 9.6* 9.9*  9.8* 11.2* 10.1*  HCT 27.9*  < > 25.4*  25.7* 27.1* 27.3*  27.7* 31.2* 28.0*  MCV 70.5*  --  70.2*  --  70.4* 67.8* 68.1*  PLT 68*  --  65*  --  95* 236 200  < > = values in this interval not displayed. Cardiac Enzymes:  Recent Labs Lab 09/05/16 0643  TROPONINI <0.03   BNP (last 3 results) No results for input(s): BNP in the last 8760 hours.  ProBNP (last 3 results) No results for input(s): PROBNP in the last 8760 hours.  CBG: No results for input(s): GLUCAP in the last 168 hours.  Recent Results (from the past 240 hour(s))  MRSA PCR Screening     Status: None   Collection Time: 08/30/16  5:41 PM  Result Value Ref Range Status   MRSA by PCR NEGATIVE NEGATIVE Final    Comment:        The GeneXpert MRSA Assay (FDA approved for NASAL specimens only), is one component of a comprehensive MRSA colonization surveillance program. It is not intended to diagnose MRSA infection nor to guide or monitor treatment for MRSA infections.   Culture, blood (Routine X 2) w Reflex to ID Panel     Status: None   Collection Time: 08/30/16  6:30 PM  Result Value Ref Range Status   Specimen Description BLOOD LEFT ARM  Final   Special Requests BOTTLES DRAWN AEROBIC AND ANAEROBIC 10CC  Final   Culture   Final    NO GROWTH 5 DAYS Performed at [facility]    Report Status 09/04/2016 FINAL  Final  Culture, blood (Routine X 2) w Reflex to ID Panel     Status: None   Collection Time: 08/30/16  6:46 PM  Result Value Ref Range Status   Specimen Description BLOOD RIGHT HAND  Final   Special Requests BOTTLES DRAWN AEROBIC AND ANAEROBIC 10CC  Final   Culture   Final    NO GROWTH 5 DAYS Performed at [facility]    Report Status 09/04/2016 FINAL  Final      Studies: Ct Abdomen Pelvis W Wo Contrast  Result Date: 08/30/2016 CLINICAL DATA:  Left upper quadrant abdominal pain and tenderness. EXAM: CT ABDOMEN AND PELVIS WITHOUT AND WITH CONTRAST TECHNIQUE: Multidetector CT imaging of the abdomen and pelvis was performed following the standard protocol before and following the bolus administration of intravenous contrast. CONTRAST:  ISOVUE-300 IOPAMIDOL (ISOVUE-300) INJECTION 61% COMPARISON:  08/09/2016 chest CT FINDINGS: Lower chest: Trace bilateral pleural effusions. Low-density precontrast  blood pool favoring anemia. Hepatobiliary: Contracted gallbladder.  Otherwise unremarkable. Pancreas: Unremarkable Spleen: Spleen measures proximally 11.0 by 7.5 by 19.1 cm (volume = 830 cm^3), with a large region of abnormal splenic hypoenhancement inferiorly favoring splenic sequestration, and irregularity at the interface between the hypoenhancing abnormally enhancing spleen. This hypoenhancing portion is about half of the splenic volume. Adrenals/Urinary Tract: Unremarkable Stomach/Bowel: Unremarkable Vascular/Lymphatic: Unremarkable Reproductive: Unremarkable Other: Small amount of pelvic ascites. Small amount of perisplenic and perihepatic ascites. Musculoskeletal: Unremarkable IMPRESSION: 1. 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 There is a small amount of perisplenic and perihepatic ascites as well as a small amount of pelvic ascites. 2. Small bilateral pleural effusions. 3. Low-density blood pool favoring anemia. These results were called by telephone at the time of interpretation on 08/30/2016 at 4:36 pm to Dr. Marcelino DusterMICHELLE Marguetta Windish , who verbally acknowledged these results. Electronically Signed   By: Gaylyn RongWalter  Liebkemann M.D.   On: 08/30/2016 16:38   Dg Chest 2 View  Result Date: 09/05/2016 CLINICAL DATA:  25 year old male, history of sickle cell anemia. Severe  left arm pain pain to left flank. EXAM: CHEST  2 VIEW COMPARISON:  08/31/2016 FINDINGS: The right lung is clear. Interval consolidation at the left lung base. Development of small left effusion. Heart size is stable. No pneumothorax. IMPRESSION: Interim development of small left-sided pleural effusion and left basilar consolidation. Electronically Signed   By: Jasmine PangKim  Fujinaga M.D.   On: 09/05/2016 01:52   Dg Chest 2 View  Result Date: 08/31/2016 CLINICAL DATA:  Sickle cell anemia with crisis EXAM: CHEST  2 VIEW COMPARISON:  08/30/2016 FINDINGS: The heart size and mediastinal contours are within normal limits. Both lungs are clear. The visualized skeletal structures are unremarkable. IMPRESSION: No active cardiopulmonary disease. Electronically Signed   By: Signa Kellaylor  Stroud M.D.   On: 08/31/2016 09:52   Dg Chest 2 View  Result Date: 08/09/2016 CLINICAL DATA:  Chest pain and shortness of breath. Sickle cell crisis. EXAM: CHEST  2 VIEW COMPARISON:  None. FINDINGS: The lungs are clear. The heart size and pulmonary vascularity are normal. No adenopathy. No bone lesions evident. IMPRESSION: No abnormality noted radiographically. Electronically Signed   By: Bretta BangWilliam  Woodruff III M.D.   On: 08/09/2016 09:19   Dg Abd 1 View  Result Date: 09/05/2016 CLINICAL DATA:  Abdominal pain and constipation. EXAM: ABDOMEN - 1 VIEW COMPARISON:  None. FINDINGS: No evidence of dilated bowel loops. Small moderate amount of stool is noted in the right colon. No radio-opaque calculi or other significant radiographic abnormality are seen. IMPRESSION: Unremarkable bowel gas pattern.  No acute findings. Electronically Signed   By: Myles RosenthalJohn  Stahl M.D.   On: 09/05/2016 08:37   Ct Head Wo Contrast  Result Date: 08/28/2016 CLINICAL DATA:  25 y/o M; severe and changing right-sided headache for 1 hour. EXAM: CT HEAD WITHOUT CONTRAST TECHNIQUE: Contiguous axial images were obtained from the base of the skull through the vertex without  intravenous contrast. COMPARISON:  None. FINDINGS: Brain: No evidence of acute infarction, hemorrhage, hydrocephalus, extra-axial collection or mass lesion/mass effect. Vascular: No hyperdense vessel or unexpected calcification. Skull: Normal. Negative for fracture or focal lesion. Sinuses/Orbits: Right sphenoid sinus mucous retention cyst. Otherwise negative. Other: None. IMPRESSION: No acute intracranial process identified.  Normal CT of the head. Electronically Signed   By: Mitzi HansenLance  Furusawa-Stratton M.D.   On: 08/28/2016 04:52   Ct Angio Chest Pe W Or Wo Contrast  Result  Date: 09/05/2016 CLINICAL DATA:  25 year old male with history of sickle cell anemia recently admitted for sickle cell pain crisis presenting with worsening left-sided chest pain, fever and chills. Hematemesis and hemoptysis just prior to admission. EXAM: CT ANGIOGRAPHY CHEST WITH CONTRAST TECHNIQUE: Multidetector CT imaging of the chest was performed using the standard protocol during bolus administration of intravenous contrast. Multiplanar CT image reconstructions and MIPs were obtained to evaluate the vascular anatomy. CONTRAST:  100 mL of Isovue 370. COMPARISON:  No priors. FINDINGS: Cardiovascular: No filling defects within the pulmonary arterial tree to suggest underlying pulmonary embolism. Heart size is normal. There is no significant pericardial fluid, thickening or pericardial calcification. No atherosclerotic disease of the thoracic aorta. Mediastinum/Nodes: No pathologically enlarged mediastinal or hilar lymph nodes. Esophagus is unremarkable in appearance. No axillary lymphadenopathy. Lungs/Pleura: Large area of airspace consolidation in the posterior aspect of the left lower lobe. Minimal subsegmental atelectasis in the right lower lobe. No suspicious pulmonary nodules or masses. No pleural effusions. Upper Abdomen: Spleen is incompletely visualized, but appears massively enlarged measuring 13.9 x 9.3 cm. Musculoskeletal: There  are no aggressive appearing lytic or blastic lesions noted in the visualized portions of the skeleton. Review of the MIP images confirms the above findings. IMPRESSION: 1. No evidence of pulmonary embolism. 2. Left lower lobe airspace consolidation, compatible with pneumonia. 3. Splenomegaly. Electronically Signed   By: Trudie Reed M.D.   On: 09/05/2016 09:00   Ct Angio Chest Pe W Or Wo Contrast  Result Date: 08/09/2016 CLINICAL DATA:  25 year old with sickle cell disease presenting with acute onset of chest pain which began last night. Elevated D-dimer. EXAM: CT ANGIOGRAPHY CHEST WITH CONTRAST TECHNIQUE: Multidetector CT imaging of the chest was performed using the standard protocol during bolus administration of intravenous contrast. Multiplanar CT image reconstructions and MIPs were obtained to evaluate the vascular anatomy. CONTRAST:  100 mL Isovue 370 IV. COMPARISON:  None. FINDINGS: Technical quality:  Excellent. Pulmonary emboli:  Absent. Cardiovascular: Upper normal heart size. No visible coronary atherosclerosis. No visible atherosclerosis involving thoracic or upper abdominal aorta or their visualized branches. No pericardial effusion. Mediastinum/Nodes: No pathologically enlarged mediastinal, hilar or axillary lymph nodes. No mediastinal masses. Normal-appearing esophagus. Residual thymic tissue in the anterior superior mediastinum. Visualized thyroid gland unremarkable. Lungs/Pleura: Pulmonary parenchyma clear without localized airspace consolidation, interstitial disease, or parenchymal nodules or masses. Central airways patent without significant bronchial wall thickening. No pleural effusions. No pleural plaques or masses. Upper Abdomen: Unremarkable for the very early arterial phase of enhancement. Musculoskeletal: Regional skeleton intact without acute or significant osseous abnormality. Review of the MIP images confirms the above findings. IMPRESSION: 1. No evidence of pulmonary embolism.  2.  No acute cardiopulmonary disease. Electronically Signed   By: Hulan Saas M.D.   On: 08/09/2016 12:44   Dg Chest Port 1 View  Result Date: 08/30/2016 CLINICAL DATA:  Fever. EXAM: PORTABLE CHEST 1 VIEW COMPARISON:  Radiographs of August 09, 2016. FINDINGS: The heart size and mediastinal contours are within normal limits. Both lungs are clear. No pneumothorax or pleural effusion is noted. The visualized skeletal structures are unremarkable. IMPRESSION: No acute cardiopulmonary abnormality seen. Electronically Signed   By: Lupita Raider, M.D.   On: 08/30/2016 20:11   Dg Shoulder Left  Result Date: 08/28/2016 CLINICAL DATA:  Acute onset of left arm and shoulder pain. Initial encounter. EXAM: LEFT SHOULDER - 2+ VIEW COMPARISON:  None. FINDINGS: There is no evidence of fracture or dislocation. The left humeral head is seated  within the glenoid fossa. The acromioclavicular joint is unremarkable in appearance. There is no evidence of sclerosis to suggest sequelae of the patient's sickle cell disease. No significant soft tissue abnormalities are seen. The visualized portions of the left lung are clear. IMPRESSION: No evidence of fracture or dislocation. The left glenohumeral joint is unremarkable on radiograph. Electronically Signed   By: Roanna Raider M.D.   On: 08/28/2016 04:34    Scheduled Meds: . ceFEPime (MAXIPIME) IV  1 g Intravenous Q8H  . folic acid  1 mg Oral Daily  . HYDROmorphone   Intravenous Q4H  . ketorolac  15 mg Intravenous Q6H  . senna-docusate  1 tablet Oral BID  . vancomycin  1,000 mg Intravenous Q8H   Continuous Infusions: . sodium chloride 100 mL/hr at 09/05/16 0522    Principal Problem:   PNA (pneumonia) Active Problems:   Sickle cell pain crisis (HCC)   Pneumonia    In excess of 30 minutes spent during this visit. Greater than 50% involved face to face contact with the patient for assessment, counseling and coordination of care.

## 2016-09-05 NOTE — Progress Notes (Signed)
PHARMACY NOTE -  Azithromycin & Ceftriaxone  Pharmacy has requested to assist with dosing of Ceftriaxone and Azithromycin for CAP.Marland Kitchen. Dosage will remain stable and need for further dosage adjustment appears unlikely at present.    Ceftriaxone 1gm IV q24h Azithromycin 500mg  IV q24h  Will sign off at this time.  Please reconsult if a change in clinical status warrants re-evaluation of dosage.  Terrilee FilesLeann Eligha Kmetz, PharmD 09/05/2016 @ 02:58

## 2016-09-05 NOTE — H&P (Addendum)
History and Physical    Justin Cala BradfordO Brase ZOX:[id number] DOB: 04-28-1991 DOA: 09/04/2016  PCP: No PCP Per Patient  Patient coming from: Home.   Chief Complaint : Left-sided chest pain. HPI: Justin Solis is a 25 y.o. male with history of sickle cell anemia who was recently admitted for sickle cell pain crisis and splenic sequestration discharged home on Augmentin and morphine presents to the ER because of worsening left-sided chest pain and fever and chills. Prior to arrival patient also had one episode of hematemesis/hemoptysis. Chest x-ray in the ER shows left-sided consolidation with effusion. Patient also has not moved his bowels for last 4-5 days. Abdomen appears benign on exam. Patient is being admitted for pneumonia versus acute chest syndrome. Patient is not hypoxic.   ED Course: Patient was started on empiric antibiotics after blood cultures were obtained.  Review of Systems: As per HPI, rest all negative.   Past Medical History:  Diagnosis Date  . Sickle cell anemia (HCC)     History reviewed. No pertinent surgical history.   reports that he has been smoking.  He has never used smokeless tobacco. He reports that he does not drink alcohol or use drugs.  No Known Allergies  Family History  Problem Relation Age of Onset  . Sickle cell anemia Mother   . Sickle cell anemia Father     Prior to Admission medications   Medication Sig Start Date End Date Taking? Authorizing Provider  amoxicillin-clavulanate (AUGMENTIN) 875-125 MG tablet Take 1 tablet by mouth 2 (two) times daily. 09/01/16 09/05/16 Yes Altha HarmMichelle A Matthews, MD  morphine (MSIR) 15 MG tablet Take 1 tablet (15 mg total) by mouth every 4 (four) hours as needed for moderate pain. 09/01/16  Yes Altha HarmMichelle A Matthews, MD  folic acid (FOLVITE) 1 MG tablet Take 1 tablet (1 mg total) by mouth daily. Patient not taking: Reported on 09/05/2016 09/02/16   Altha HarmMichelle A Matthews, MD    Physical Exam: Vitals:   09/05/16  0230 09/05/16 0300 09/05/16 0330 09/05/16 0402  BP: 108/67 124/80 106/67 127/85  Pulse: 78 67 75 81  Resp: 14 12 18 18   Temp:    97.7 F (36.5 C)  TempSrc:    Oral  SpO2: 96% 99% 95% 100%      Constitutional: Moderately built and nourished. Vitals:   09/05/16 0230 09/05/16 0300 09/05/16 0330 09/05/16 0402  BP: 108/67 124/80 106/67 127/85  Pulse: 78 67 75 81  Resp: 14 12 18 18   Temp:    97.7 F (36.5 C)  TempSrc:    Oral  SpO2: 96% 99% 95% 100%   Eyes: Anicteric no pallor. ENMT: No discharge from the ears eyes nose and mouth. Neck: No mass felt. No JVD appreciated. Respiratory: No rhonchi or crepitations. Cardiovascular: S1 and S2 heard. No murmurs appreciated. Abdomen: Soft nontender bowel sounds present. Musculoskeletal: No edema. No joint effusion. Skin: No rash. Skin appears warm. Neurologic: Alert awake oriented to time place and person. Moves all extremities. Psychiatric: Appears normal. Normal affect.   Labs on Admission: I have personally reviewed following labs and imaging studies  CBC:  Recent Labs Lab 08/30/16 1213  08/31/16 0057 08/31/16 0820 08/31/16 1534 09/01/16 0429 09/04/16 2335  WBC 10.9*  --   --  9.5  --  8.6 10.2  NEUTROABS 8.5*  --   --  7.0  --  6.3 7.9*  HGB 9.8*  < > 9.7* 9.2*  9.2* 9.6* 9.9*  9.8* 11.2*  HCT 27.9*  < >  27.2* 25.4*  25.7* 27.1* 27.3*  27.7* 31.2*  MCV 70.5*  --   --  70.2*  --  70.4* 67.8*  PLT 68*  --   --  65*  --  95* 236  < > = values in this interval not displayed. Basic Metabolic Panel:  Recent Labs Lab 08/30/16 1715 09/04/16 2335  NA 138 138  K 3.9 3.8  CL 105 106  CO2 30 25  GLUCOSE 112* 107*  BUN 8 12  CREATININE 0.99 0.88  CALCIUM 9.1 9.3   GFR: Estimated Creatinine Clearance: 144.1 mL/min (by C-G formula based on SCr of 0.88 mg/dL). Liver Function Tests:  Recent Labs Lab 08/30/16 1715 09/04/16 2335  AST 25 30  ALT 15* 38  ALKPHOS 50 55  BILITOT 1.3* 0.8  PROT 6.8 7.6  ALBUMIN 4.0  4.2    Recent Labs Lab 09/04/16 2335  LIPASE 12   No results for input(s): AMMONIA in the last 168 hours. Coagulation Profile: No results for input(s): INR, PROTIME in the last 168 hours. Cardiac Enzymes: No results for input(s): CKTOTAL, CKMB, CKMBINDEX, TROPONINI in the last 168 hours. BNP (last 3 results) No results for input(s): PROBNP in the last 8760 hours. HbA1C: No results for input(s): HGBA1C in the last 72 hours. CBG: No results for input(s): GLUCAP in the last 168 hours. Lipid Profile: No results for input(s): CHOL, HDL, LDLCALC, TRIG, CHOLHDL, LDLDIRECT in the last 72 hours. Thyroid Function Tests: No results for input(s): TSH, T4TOTAL, FREET4, T3FREE, THYROIDAB in the last 72 hours. Anemia Panel:  Recent Labs  09/04/16 2335  RETICCTPCT 1.2   Urine analysis:    Component Value Date/Time   COLORURINE YELLOW 08/31/2016 1150   APPEARANCEUR CLEAR 08/31/2016 1150   LABSPEC 1.014 08/31/2016 1150   PHURINE 6.0 08/31/2016 1150   GLUCOSEU NEGATIVE 08/31/2016 1150   HGBUR NEGATIVE 08/31/2016 1150   BILIRUBINUR NEGATIVE 08/31/2016 1150   KETONESUR NEGATIVE 08/31/2016 1150   PROTEINUR NEGATIVE 08/31/2016 1150   NITRITE NEGATIVE 08/31/2016 1150   LEUKOCYTESUR NEGATIVE 08/31/2016 1150   Sepsis Labs: @LABRCNTIP (procalcitonin:4,lacticidven:4) ) Recent Results (from the past 240 hour(s))  MRSA PCR Screening     Status: None   Collection Time: 08/30/16  5:41 PM  Result Value Ref Range Status   MRSA by PCR NEGATIVE NEGATIVE Final    Comment:        The GeneXpert MRSA Assay (FDA approved for NASAL specimens only), is one component of a comprehensive MRSA colonization surveillance program. It is not intended to diagnose MRSA infection nor to guide or monitor treatment for MRSA infections.   Culture, blood (Routine X 2) w Reflex to ID Panel     Status: None   Collection Time: 08/30/16  6:30 PM  Result Value Ref Range Status   Specimen Description BLOOD LEFT  ARM  Final   Special Requests BOTTLES DRAWN AEROBIC AND ANAEROBIC 10CC  Final   Culture   Final    NO GROWTH 5 DAYS Performed at [facility]    Report Status 09/04/2016 FINAL  Final  Culture, blood (Routine X 2) w Reflex to ID Panel     Status: None   Collection Time: 08/30/16  6:46 PM  Result Value Ref Range Status   Specimen Description BLOOD RIGHT HAND  Final   Special Requests BOTTLES DRAWN AEROBIC AND ANAEROBIC 10CC  Final   Culture   Final    NO GROWTH 5 DAYS Performed at [facility]  Report Status 09/04/2016 FINAL  Final     Radiological Exams on Admission: Dg Chest 2 View  Result Date: 09/05/2016 CLINICAL DATA:  25 year old male, history of sickle cell anemia. Severe left arm pain pain to left flank. EXAM: CHEST  2 VIEW COMPARISON:  08/31/2016 FINDINGS: The right lung is clear. Interval consolidation at the left lung base. Development of small left effusion. Heart size is stable. No pneumothorax. IMPRESSION: Interim development of small left-sided pleural effusion and left basilar consolidation. Electronically Signed   By: Jasmine Pang M.D.   On: 09/05/2016 01:52    EKG: Independently reviewed. Normal sinus rhythm with nonspecific ST changes.  Assessment/Plan Principal Problem:   PNA (pneumonia) Active Problems:   Sickle cell pain crisis (HCC)   Pneumonia    1. Pneumonia - versus acute chest syndrome. Patient is not hypoxic. We'll get a CT angiogram of the chest. For now I have placed patient on vancomycin and cefepime to cover for healthcare associated pneumonia. Follow blood cultures. 2. Hematemesis versus hemoptysis - patient does not clearly know if he threw up or coughed up blood. Will check CT chest. Follow CBC. For now will avoid NSAIDs. 3. Constipation - likely secondary to pain medications. Check KUB and if there is no signs of obstruction will keep patient on laxatives. May need ENEMA. 4. Sickle cell anemia pain crisis and recently admitted  for splenic sequestration - patient on PCA and empiric antibiotics.  I have reviewed patient's old charts and labs.   DVT prophylaxis: SCDs for now. Code Status: Full code.  Family Communication: Discussed with patient.  Disposition Plan: Home.  Consults called: None.  Admission status: Inpatient.    Eduard Clos MD [facility] Pager [phone].  If 7PM-7AM, please contact night-coverage [email or web address] Password [email or web address]  09/05/2016, 4:44 AM

## 2016-09-05 NOTE — Progress Notes (Signed)
Pharmacy Antibiotic Note  Justin Solis is a 25 y.o. male admitted on 09/04/2016 with pneumonia.  Pharmacy originally was requested to dose Ceftriaxone & Azithromycin.  Patient received initial doses.  Upon admission, ceftriaxone & azithromycin have been d/c'ed and Pharmacy has been consulted for Vancomycin dosing and to adjust other antibiotics as needed based on renal function.  Plan:  Vancomycin 1gm IV q8h  Cefepime 1gm IV q8h as ordered by MD  F/u cultures, renal function, clinical course    Temp (24hrs), Avg:98.8 F (37.1 C), Min:97.7 F (36.5 C), Max:99.9 F (37.7 C)   Recent Labs Lab 08/30/16 1213 08/30/16 1715 08/31/16 0820 09/01/16 0429 09/04/16 2335  WBC 10.9*  --  9.5 8.6 10.2  CREATININE  --  0.99  --   --  0.88    Estimated Creatinine Clearance: 144.1 mL/min (by C-G formula based on SCr of 0.88 mg/dL).    No Known Allergies  Antimicrobials this admission: 10/23 ceftriaxone >> 10/23 10/23 azithromycin >> 10/23 10/23 vanc >> 10/23 cefepime >>  Dose adjustments this admission:    Microbiology results: 10/23 BCx: sent 10/23 Sputum: sent   Thank you for allowing pharmacy to be a part of this patient's care.  Maryellen PilePoindexter, Rosalynd Mcwright Trefz, PharmD 09/05/2016 4:48 AM

## 2016-09-05 NOTE — ED Notes (Signed)
EKG given to EDP,Long,MD., for review. 

## 2016-09-06 ENCOUNTER — Other Ambulatory Visit: Payer: Self-pay

## 2016-09-06 DIAGNOSIS — D5700 Hb-SS disease with crisis, unspecified: Secondary | ICD-10-CM

## 2016-09-06 DIAGNOSIS — J181 Lobar pneumonia, unspecified organism: Secondary | ICD-10-CM

## 2016-09-06 LAB — BASIC METABOLIC PANEL
Anion gap: 6 (ref 5–15)
BUN: 11 mg/dL (ref 6–20)
CALCIUM: 8.9 mg/dL (ref 8.9–10.3)
CHLORIDE: 107 mmol/L (ref 101–111)
CO2: 27 mmol/L (ref 22–32)
CREATININE: 0.85 mg/dL (ref 0.61–1.24)
GFR calc Af Amer: >60 mL/min (ref >60)
GFR calc non Af Amer: >60 mL/min (ref >60)
GLUCOSE: 94 mg/dL (ref 65–99)
Potassium: 3.9 mmol/L (ref 3.5–5.1)
Sodium: 140 mmol/L (ref 135–145)

## 2016-09-06 LAB — LEGIONELLA PNEUMOPHILA SEROGP 1 UR AG: L. pneumophila Serogp 1 Ur Ag: NEGATIVE

## 2016-09-06 MED ORDER — MORPHINE SULFATE 15 MG PO TABS
15.0000 mg | ORAL_TABLET | ORAL | Status: DC | PRN
  Administered 2016-09-06: 15 mg via ORAL
  Filled 2016-09-06: qty 1

## 2016-09-06 MED ORDER — LEVOFLOXACIN 750 MG PO TABS
750.0000 mg | ORAL_TABLET | Freq: Every day | ORAL | Status: DC
  Administered 2016-09-06 – 2016-09-07 (×2): 750 mg via ORAL
  Filled 2016-09-06 (×2): qty 1

## 2016-09-06 MED ORDER — SODIUM CHLORIDE 0.45 % IV SOLN
INTRAVENOUS | Status: DC
  Administered 2016-09-06 – 2016-09-07 (×3): via INTRAVENOUS

## 2016-09-06 NOTE — Care Management Note (Signed)
Case Management Note  Patient Details  Name: Ignacia MarvelChe-Okee O Bodner MRN: [id number] Date of Birth: 11-Oct-1991  Subjective/Objective:    25 yo admitted with PNA. Pt also with Sickle cell disease.                Action/Plan: From home with significant other. This CM made [facility] admission for Dec 12th. Per Epic it appears this appointment was moved up to 09/08/16. CM will continue to follow and assist with DC needs.  Expected Discharge Date:   (unknown)               Expected Discharge Plan:  Home/Self Care  In-House Referral:     Discharge planning Services  CM Consult  Post Acute Care Choice:    Choice offered to:     DME Arranged:    DME Agency:     HH Arranged:    HH Agency:     Status of Service:  In process, will continue to follow  If discussed at Long Length of Stay Meetings, dates discussed:    Additional CommentsBartholome Bill:  Krisna Omar H, RN 09/06/2016, 2:28 PM  [phone]

## 2016-09-07 LAB — CBC WITH DIFFERENTIAL/PLATELET
Basophils Absolute: 0 10*3/uL (ref 0.0–0.1)
Basophils Relative: 0 %
EOS PCT: 3 %
Eosinophils Absolute: 0.3 10*3/uL (ref 0.0–0.7)
HEMATOCRIT: 26.6 % — AB (ref 39.0–52.0)
HEMOGLOBIN: 9.6 g/dL — AB (ref 13.0–17.0)
LYMPHS ABS: 1.8 10*3/uL (ref 0.7–4.0)
Lymphocytes Relative: 17 %
MCH: 24.5 pg — ABNORMAL LOW (ref 26.0–34.0)
MCHC: 36.1 g/dL — AB (ref 30.0–36.0)
MCV: 67.9 fL — ABNORMAL LOW (ref 78.0–100.0)
MONOS PCT: 12 %
Monocytes Absolute: 1.2 10*3/uL — ABNORMAL HIGH (ref 0.1–1.0)
Neutro Abs: 7 10*3/uL (ref 1.7–7.7)
Neutrophils Relative %: 68 %
Platelets: 263 10*3/uL (ref 150–400)
RBC: 3.92 MIL/uL — AB (ref 4.22–5.81)
RDW: 17.5 % — ABNORMAL HIGH (ref 11.5–15.5)
WBC: 10.3 10*3/uL (ref 4.0–10.5)

## 2016-09-07 LAB — RETICULOCYTES
RBC.: 3.92 MIL/uL — AB (ref 4.22–5.81)
RETIC COUNT ABSOLUTE: 58.8 10*3/uL (ref 19.0–186.0)
Retic Ct Pct: 1.5 % (ref 0.4–3.1)

## 2016-09-07 MED ORDER — LEVOFLOXACIN 750 MG PO TABS: 750.0000 mg | ORAL_TABLET | Freq: Every day | ORAL | 0 refills | Status: AC

## 2016-09-07 NOTE — Discharge Summary (Signed)
Justin Solis MRN: [id number] DOB/AGE: [birth year] 25 y.o.  Admit date: 09/04/2016 Discharge date: 09/07/2016  Primary Care Physician:  No PCP Per Patient   Discharge Diagnoses:   Patient Active Problem List   Diagnosis Date Noted  . PNA (pneumonia) 09/05/2016  . Pneumonia 09/05/2016  . Sickle cell anemia with pain (HCC) 08/29/2016  . Sickle cell pain crisis (HCC) 08/28/2016    DISCHARGE MEDICATION:   Medication List    STOP taking these medications   amoxicillin-clavulanate 875-125 MG tablet Commonly known as:  AUGMENTIN     TAKE these medications   folic acid 1 MG tablet Commonly known as:  FOLVITE Take 1 tablet (1 mg total) by mouth daily.   levofloxacin 750 MG tablet Commonly known as:  LEVAQUIN Take 1 tablet (750 mg total) by mouth daily.   morphine 15 MG tablet Commonly known as:  MSIR Take 1 tablet (15 mg total) by mouth every 4 (four) hours as needed for moderate pain.         Consults:    SIGNIFICANT DIAGNOSTIC STUDIES:  Ct Abdomen Pelvis W Wo Contrast  Result Date: 08/30/2016 CLINICAL DATA:  Left upper quadrant abdominal pain and tenderness. EXAM: CT ABDOMEN AND PELVIS WITHOUT AND WITH CONTRAST TECHNIQUE: Multidetector CT imaging of the abdomen and pelvis was performed following the standard protocol before and following the bolus administration of intravenous contrast. CONTRAST:  ISOVUE-300 IOPAMIDOL (ISOVUE-300) INJECTION 61% COMPARISON:  08/09/2016 chest CT FINDINGS: Lower chest: Trace bilateral pleural effusions. Low-density precontrast blood pool favoring anemia. Hepatobiliary: Contracted gallbladder.  Otherwise unremarkable. Pancreas: Unremarkable Spleen: Spleen measures proximally 11.0 by 7.5 by 19.1 cm (volume = 830 cm^3), with a large region of abnormal splenic hypoenhancement inferiorly favoring splenic sequestration, and irregularity at the interface between the hypoenhancing abnormally enhancing spleen. This hypoenhancing portion is  about half of the splenic volume. Adrenals/Urinary Tract: Unremarkable Stomach/Bowel: Unremarkable Vascular/Lymphatic: Unremarkable Reproductive: Unremarkable Other: Small amount of pelvic ascites. Small amount of perisplenic and perihepatic ascites. Musculoskeletal: Unremarkable IMPRESSION: 1. 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 There is a small amount of perisplenic and perihepatic ascites as well as a small amount of pelvic ascites. 2. Small bilateral pleural effusions. 3. Low-density blood pool favoring anemia. These results were called by telephone at the time of interpretation on 08/30/2016 at 4:36 pm to Dr. Marcelino Duster Jadon Ressler , who verbally acknowledged these results. Electronically Signed   By: Gaylyn Rong M.D.   On: 08/30/2016 16:38   Dg Chest 2 View  Result Date: 09/05/2016 CLINICAL DATA:  25 year old male, history of sickle cell anemia. Severe left arm pain pain to left flank. EXAM: CHEST  2 VIEW COMPARISON:  08/31/2016 FINDINGS: The right lung is clear. Interval consolidation at the left lung base. Development of small left effusion. Heart size is stable. No pneumothorax. IMPRESSION: Interim development of small left-sided pleural effusion and left basilar consolidation. Electronically Signed   By: Jasmine Pang M.D.   On: 09/05/2016 01:52   Dg Chest 2 View  Result Date: 08/31/2016 CLINICAL DATA:  Sickle cell anemia with crisis EXAM: CHEST  2 VIEW COMPARISON:  08/30/2016 FINDINGS: The heart size and mediastinal contours are within normal limits. Both lungs are clear. The visualized skeletal structures are unremarkable. IMPRESSION: No active cardiopulmonary disease. Electronically Signed   By: Signa Kell M.D.   On: 08/31/2016 09:52   Dg Chest 2 View  Result Date: 08/09/2016 CLINICAL DATA:  Chest pain  and shortness of breath. Sickle cell crisis. EXAM: CHEST  2 VIEW COMPARISON:   None. FINDINGS: The lungs are clear. The heart size and pulmonary vascularity are normal. No adenopathy. No bone lesions evident. IMPRESSION: No abnormality noted radiographically. Electronically Signed   By: Bretta Bang III M.D.   On: 08/09/2016 09:19   Dg Abd 1 View  Result Date: 09/05/2016 CLINICAL DATA:  Abdominal pain and constipation. EXAM: ABDOMEN - 1 VIEW COMPARISON:  None. FINDINGS: No evidence of dilated bowel loops. Small moderate amount of stool is noted in the right colon. No radio-opaque calculi or other significant radiographic abnormality are seen. IMPRESSION: Unremarkable bowel gas pattern.  No acute findings. Electronically Signed   By: Myles Rosenthal M.D.   On: 09/05/2016 08:37   Ct Head Wo Contrast  Result Date: 08/28/2016 CLINICAL DATA:  24 y/o M; severe and changing right-sided headache for 1 hour. EXAM: CT HEAD WITHOUT CONTRAST TECHNIQUE: Contiguous axial images were obtained from the base of the skull through the vertex without intravenous contrast. COMPARISON:  None. FINDINGS: Brain: No evidence of acute infarction, hemorrhage, hydrocephalus, extra-axial collection or mass lesion/mass effect. Vascular: No hyperdense vessel or unexpected calcification. Skull: Normal. Negative for fracture or focal lesion. Sinuses/Orbits: Right sphenoid sinus mucous retention cyst. Otherwise negative. Other: None. IMPRESSION: No acute intracranial process identified.  Normal CT of the head. Electronically Signed   By: Mitzi Hansen M.D.   On: 08/28/2016 04:52   Ct Angio Chest Pe W Or Wo Contrast  Result Date: 09/05/2016 CLINICAL DATA:  25 year old male with history of sickle cell anemia recently admitted for sickle cell pain crisis presenting with worsening left-sided chest pain, fever and chills. Hematemesis and hemoptysis just prior to admission. EXAM: CT ANGIOGRAPHY CHEST WITH CONTRAST TECHNIQUE: Multidetector CT imaging of the chest was performed using the standard protocol  during bolus administration of intravenous contrast. Multiplanar CT image reconstructions and MIPs were obtained to evaluate the vascular anatomy. CONTRAST:  100 mL of Isovue 370. COMPARISON:  No priors. FINDINGS: Cardiovascular: No filling defects within the pulmonary arterial tree to suggest underlying pulmonary embolism. Heart size is normal. There is no significant pericardial fluid, thickening or pericardial calcification. No atherosclerotic disease of the thoracic aorta. Mediastinum/Nodes: No pathologically enlarged mediastinal or hilar lymph nodes. Esophagus is unremarkable in appearance. No axillary lymphadenopathy. Lungs/Pleura: Large area of airspace consolidation in the posterior aspect of the left lower lobe. Minimal subsegmental atelectasis in the right lower lobe. No suspicious pulmonary nodules or masses. No pleural effusions. Upper Abdomen: Spleen is incompletely visualized, but appears massively enlarged measuring 13.9 x 9.3 cm. Musculoskeletal: There are no aggressive appearing lytic or blastic lesions noted in the visualized portions of the skeleton. Review of the MIP images confirms the above findings. IMPRESSION: 1. No evidence of pulmonary embolism. 2. Left lower lobe airspace consolidation, compatible with pneumonia. 3. Splenomegaly. Electronically Signed   By: Trudie Reed M.D.   On: 09/05/2016 09:00   Ct Angio Chest Pe W Or Wo Contrast  Result Date: 08/09/2016 CLINICAL DATA:  25 year old with sickle cell disease presenting with acute onset of chest pain which began last night. Elevated D-dimer. EXAM: CT ANGIOGRAPHY CHEST WITH CONTRAST TECHNIQUE: Multidetector CT imaging of the chest was performed using the standard protocol during bolus administration of intravenous contrast. Multiplanar CT image reconstructions and MIPs were obtained to evaluate the vascular anatomy. CONTRAST:  100 mL Isovue 370 IV. COMPARISON:  None. FINDINGS: Technical quality:  Excellent. Pulmonary emboli:   Absent. Cardiovascular: Upper  normal heart size. No visible coronary atherosclerosis. No visible atherosclerosis involving thoracic or upper abdominal aorta or their visualized branches. No pericardial effusion. Mediastinum/Nodes: No pathologically enlarged mediastinal, hilar or axillary lymph nodes. No mediastinal masses. Normal-appearing esophagus. Residual thymic tissue in the anterior superior mediastinum. Visualized thyroid gland unremarkable. Lungs/Pleura: Pulmonary parenchyma clear without localized airspace consolidation, interstitial disease, or parenchymal nodules or masses. Central airways patent without significant bronchial wall thickening. No pleural effusions. No pleural plaques or masses. Upper Abdomen: Unremarkable for the very early arterial phase of enhancement. Musculoskeletal: Regional skeleton intact without acute or significant osseous abnormality. Review of the MIP images confirms the above findings. IMPRESSION: 1. No evidence of pulmonary embolism. 2.  No acute cardiopulmonary disease. Electronically Signed   By: Hulan Saas M.D.   On: 08/09/2016 12:44   Dg Chest Port 1 View  Result Date: 08/30/2016 CLINICAL DATA:  Fever. EXAM: PORTABLE CHEST 1 VIEW COMPARISON:  Radiographs of August 09, 2016. FINDINGS: The heart size and mediastinal contours are within normal limits. Both lungs are clear. No pneumothorax or pleural effusion is noted. The visualized skeletal structures are unremarkable. IMPRESSION: No acute cardiopulmonary abnormality seen. Electronically Signed   By: Lupita Raider, M.D.   On: 08/30/2016 20:11   Dg Shoulder Left  Result Date: 08/28/2016 CLINICAL DATA:  Acute onset of left arm and shoulder pain. Initial encounter. EXAM: LEFT SHOULDER - 2+ VIEW COMPARISON:  None. FINDINGS: There is no evidence of fracture or dislocation. The left humeral head is seated within the glenoid fossa. The acromioclavicular joint is unremarkable in appearance. There is no evidence  of sclerosis to suggest sequelae of the patient's sickle cell disease. No significant soft tissue abnormalities are seen. The visualized portions of the left lung are clear. IMPRESSION: No evidence of fracture or dislocation. The left glenohumeral joint is unremarkable on radiograph. Electronically Signed   By: Roanna Raider M.D.   On: 08/28/2016 04:34       Recent Results (from the past 240 hour(s))  MRSA PCR Screening     Status: None   Collection Time: 08/30/16  5:41 PM  Result Value Ref Range Status   MRSA by PCR NEGATIVE NEGATIVE Final    Comment:        The GeneXpert MRSA Assay (FDA approved for NASAL specimens only), is one component of a comprehensive MRSA colonization surveillance program. It is not intended to diagnose MRSA infection nor to guide or monitor treatment for MRSA infections.   Culture, blood (Routine X 2) w Reflex to ID Panel     Status: None   Collection Time: 08/30/16  6:30 PM  Result Value Ref Range Status   Specimen Description BLOOD LEFT ARM  Final   Special Requests BOTTLES DRAWN AEROBIC AND ANAEROBIC 10CC  Final   Culture   Final    NO GROWTH 5 DAYS Performed at [facility]    Report Status 09/04/2016 FINAL  Final  Culture, blood (Routine X 2) w Reflex to ID Panel     Status: None   Collection Time: 08/30/16  6:46 PM  Result Value Ref Range Status   Specimen Description BLOOD RIGHT HAND  Final   Special Requests BOTTLES DRAWN AEROBIC AND ANAEROBIC 10CC  Final   Culture   Final    NO GROWTH 5 DAYS Performed at [facility]    Report Status 09/04/2016 FINAL  Final  Culture, blood (routine x 2)     Status: None (Preliminary result)  Collection Time: 09/05/16  3:09 AM  Result Value Ref Range Status   Specimen Description BLOOD RIGHT ANTECUBITAL  Final   Special Requests IN PEDIATRIC BOTTLE  Final   Culture   Final    NO GROWTH 1 DAY Performed at [facility]    Report Status PENDING  Incomplete  Culture, blood  (routine x 2)     Status: None (Preliminary result)   Collection Time: 09/05/16  3:14 AM  Result Value Ref Range Status   Specimen Description BLOOD LEFT HAND  Final   Special Requests IN PEDIATRIC BOTTLE  Final   Culture   Final    NO GROWTH 1 DAY Performed at [facility]    Report Status PENDING  Incomplete    BRIEF ADMITTING H & P: This is an opoate naive patient with Hb [place] who was recently discharged on antibiotics after an admission for splenic sequestration. During that hospitalization he had fever for 24 hours and was empirically placed on Zosyn during hospitalization. He remained afebrile with a negative CXR and no other signs of a pneumonia for remainder of hospital course and was discharged home on Augmentin. Pt reported that he was compliant with the Augmentin but started developing a fever as high as 103. This was accompanied by nose bleed and early satiety. Since fever persisted and due to the epistaxis he presented to the ED for evaluation. In the ED, CTA showed consolidation in the LLL. He is admitted for pneumonia and failed out- patient therapy.    Hospital Course:  Present on Admission: . PNA (pneumonia): Pt was placed on Cefepime and Vancomycin therapy on admission. He defervesed on this therapy and antibiotics were changed to Levaquin. He remained afebrile throughout the hospital course and saturations on RA were between 96-100% with ambulation. He is discharged home on Levaquin to complete an additional 4 days of therapy for a 8 day course of antibiotic therapy.  . Sickle cell pain crisis [facility]): Pt had some pain likely triggered by the infection. He was placed on Dilaudid PCA, Toradol and IVF. His pain improved and he was transitioned from PCA to oral morphine. With minimal utilazation of the MS IR. A the time of discharge he is without any pain.  . 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 Pt to  re-schedule appointment at [facility] to establish care.    Disposition and Follow-up: Pt discharged home in good condition. He has no PMD and is to call the [facility] to re-schedule his appointment to establish care.   Discharge Instructions    Activity as tolerated - No restrictions    Complete by:  As directed    Diet general    Complete by:  As directed       DISCHARGE EXAM:  General: Alert, awake, oriented x3, in no apparent distress. Well appearing HEENT: [facility]/AT PEERL, EOMI, anicteric Neck: Trachea midline, no masses, no thyromegal,y no JVD, no carotid bruit OROPHARYNX: Moist, No exudate/ erythema/lesions.  Heart: Regular rate and rhythm, without murmurs, rubs, gallops or S3. PMI non-displaced. Exam reveals no decreased pulses. Pulmonary/Chest: Normal effort. Breath sounds normal. No. Apnea. Clear to auscultation,no stridor,  no wheezing and no rhonchi noted. No respiratory distress and no tenderness noted. Abdomen: Soft, nontender, nondistended, normal bowel sounds, no masses no hepatosplenomegaly noted. No fluid wave and no ascites. There is no guarding or rebound. Neuro: Alert and oriented to person, place and time. Normal motor  skills, Displays no atrophy or tremors and exhibits normal muscle tone.  No focal neurological deficits noted cranial nerves II through XII grossly intact. No sensory deficit noted. Strength at baseline in bilateral upper and lower extremities. Gait normal. Musculoskeletal: No warmth swelling or erythema around joints, no spinal tenderness noted. Psychiatric: Patient alert and oriented x3, good insight and cognition. Mood, memory, affect and judgement normal Lymph node survey: No cervical axillary or inguinal lymphadenopathy noted. Skin: Skin is warm and dry. No bruising, no ecchymosis and no rash noted. Pt is not diaphoretic. No erythema. No pallor   Blood pressure 119/77, pulse 78, temperature 97.8 F (36.6 C), temperature source Oral, resp. rate 17, height 6'  2" (1.88 m), weight 77.3 kg (170 lb 6.4 oz), SpO2 99 %.   Recent Labs  09/05/16 0643 09/06/16 0833  NA 139 140  K 3.8 3.9  CL 107 107  CO2 26 27  GLUCOSE 94 94  BUN 11 11  CREATININE 0.69 0.85  CALCIUM 8.8* 8.9    Recent Labs  09/04/16 2335 09/05/16 0643  AST 30 24  ALT 38 33  ALKPHOS 55 50  BILITOT 0.8 0.7  PROT 7.6 6.8  ALBUMIN 4.2 3.6    Recent Labs  09/04/16 2335  LIPASE 12    Recent Labs  09/05/16 0643 09/07/16 0407  WBC 11.0* 10.3  NEUTROABS 7.4 7.0  HGB 10.1* 9.6*  HCT 28.0* 26.6*  MCV 68.1* 67.9*  PLT 200 263     Total time spent including face to face and decision making was greater than 30 minutes  Signed: Eiko Mcgowen A. 09/07/2016, 11:05 AM

## 2016-09-08 ENCOUNTER — Ambulatory Visit: Payer: Self-pay | Admitting: Family Medicine

## 2016-09-10 LAB — CULTURE, BLOOD (ROUTINE X 2)
CULTURE: NO GROWTH
Culture: NO GROWTH

## 2016-10-25 ENCOUNTER — Ambulatory Visit: Payer: Self-pay | Admitting: Internal Medicine

## 2017-05-10 IMAGING — CT CT HEAD W/O CM
3 of 4 series · 15 of 47 positions shown, 18 images · non-contrast
Comparison: None.

CLINICAL DATA: 25 y/o M; severe and changing right-sided headache
for 1 hour.

EXAM:
CT HEAD WITHOUT CONTRAST
TECHNIQUE: Contiguous axial images were obtained from the base of the skull
through the vertex without intravenous contrast.

[Series 2: head w/o · axial · non-contrast · 0.45mm/px · z∈[-168,-48]mm · 9 of 29 slices shown, 12 images]
[im 3/29  brain]
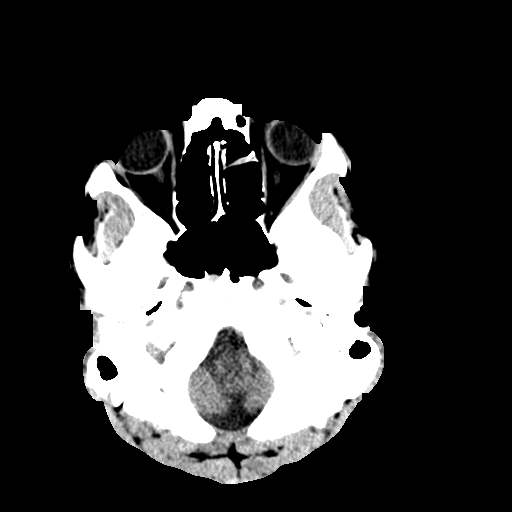
[im 3/29  bone]
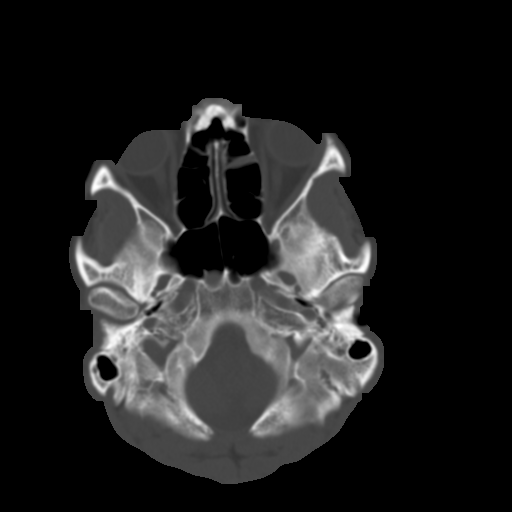
[im 7/29  brain]
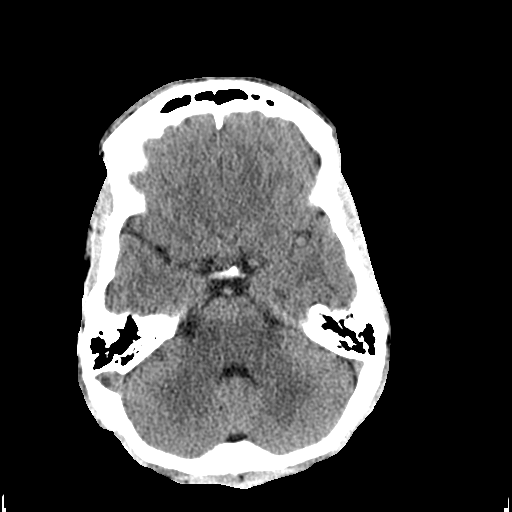
[im 9/29  brain]
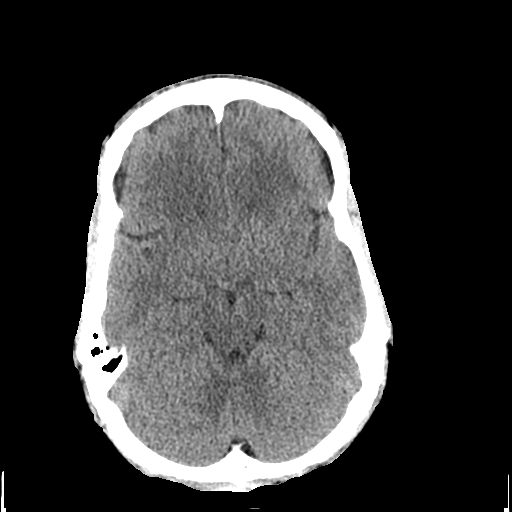
[im 13/29  brain]
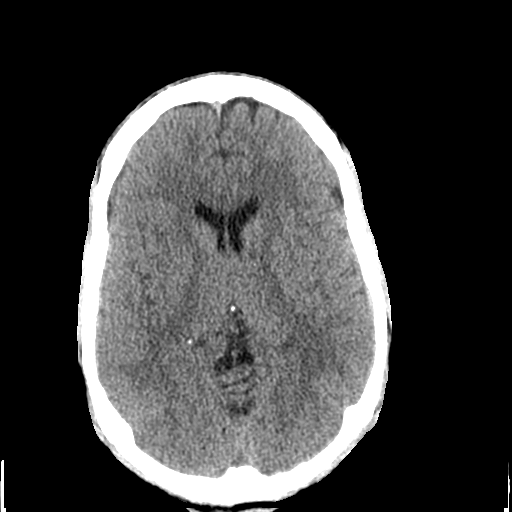
[im 15/29  brain]
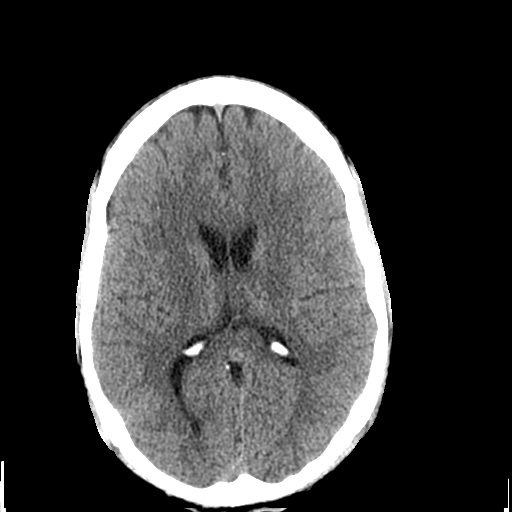
[im 15/29  bone]
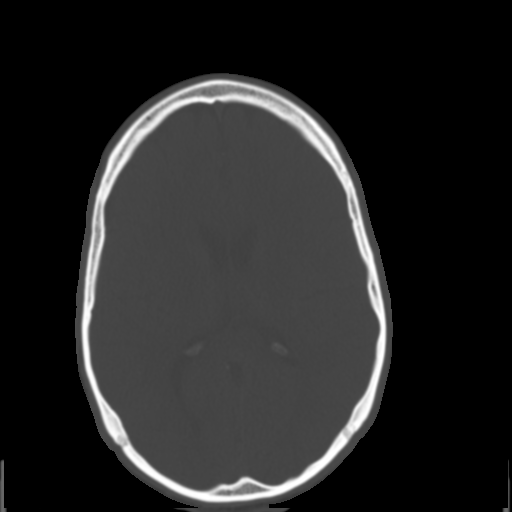
[im 17/29  brain]
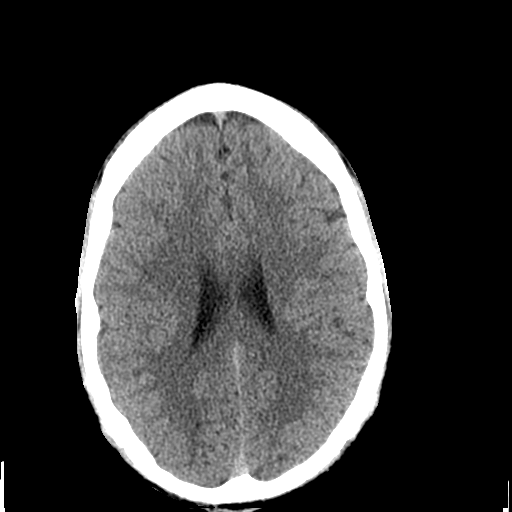
[im 21/29  brain]
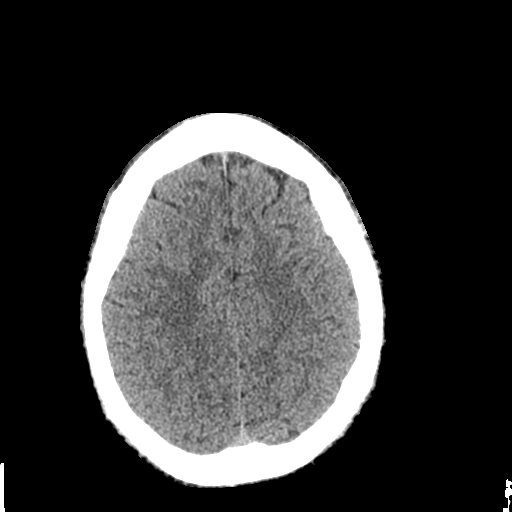
[im 23/29  brain]
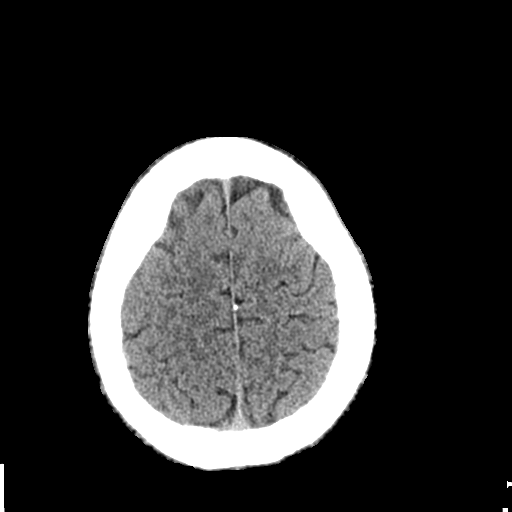
[im 27/29  brain]
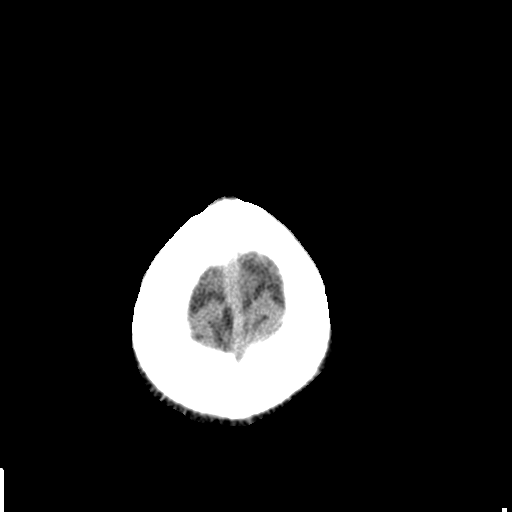
[im 27/29  bone]
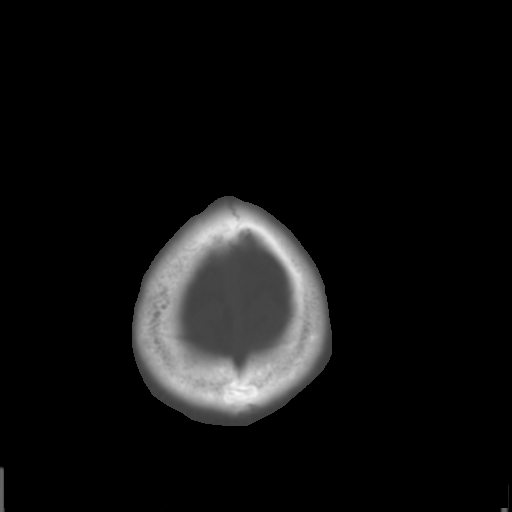

[Series 5: coronal · coronal · 0.28mm/px · 3 of 66 slices shown]
[im 22/66  brain]
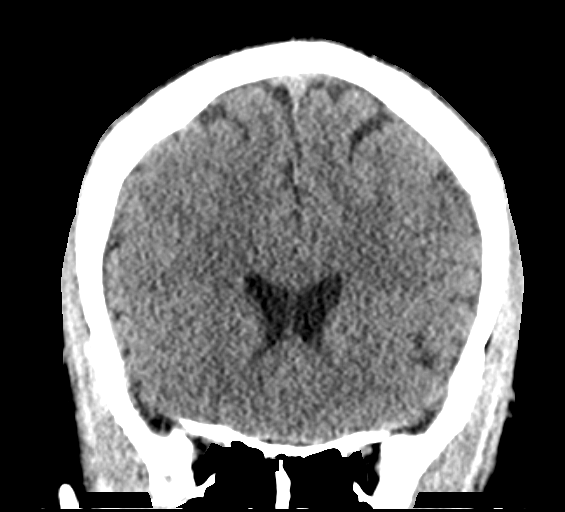
[im 29/66  brain]
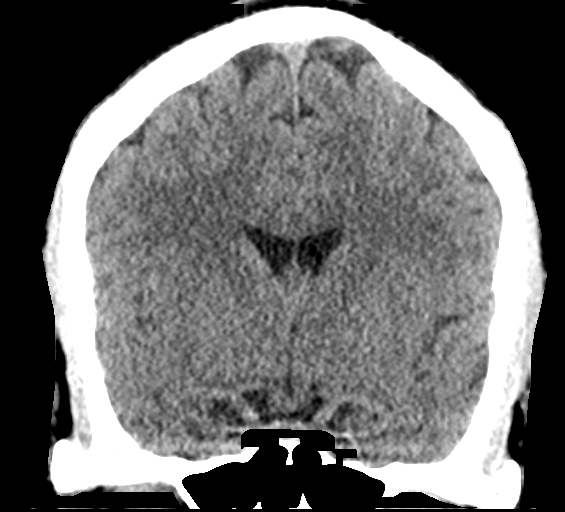
[im 37/66  brain]
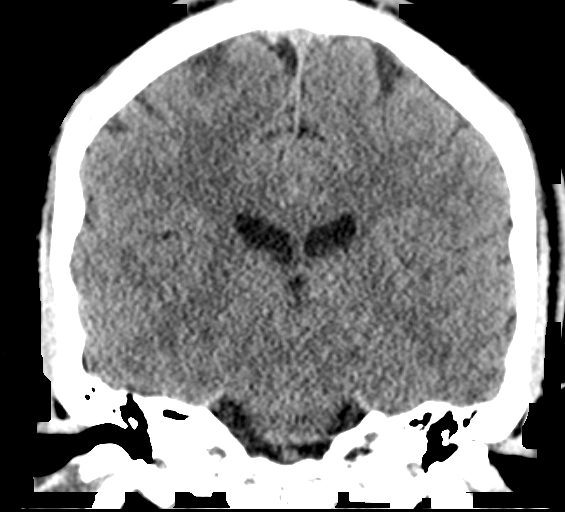

[Series 6: sagittal · sagittal · 0.30mm/px · 3 of 49 slices shown]
[im 17/49  brain]
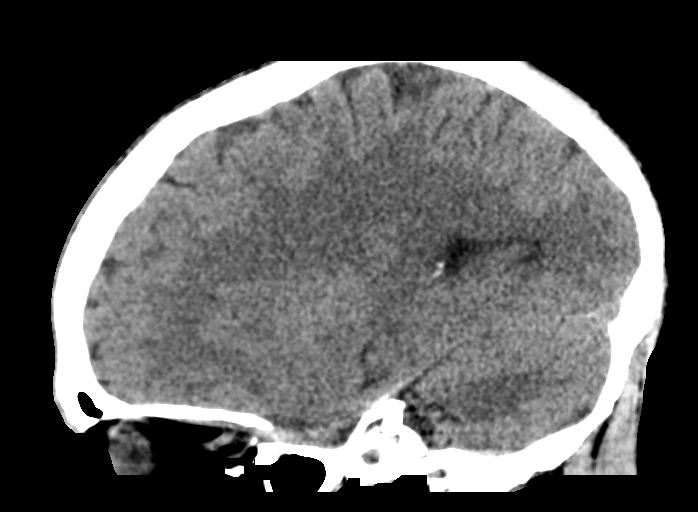
[im 25/49  brain]
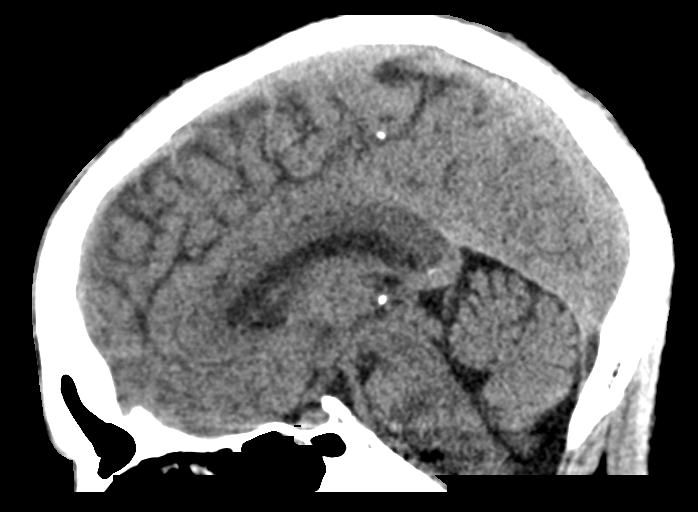
[im 33/49  brain]
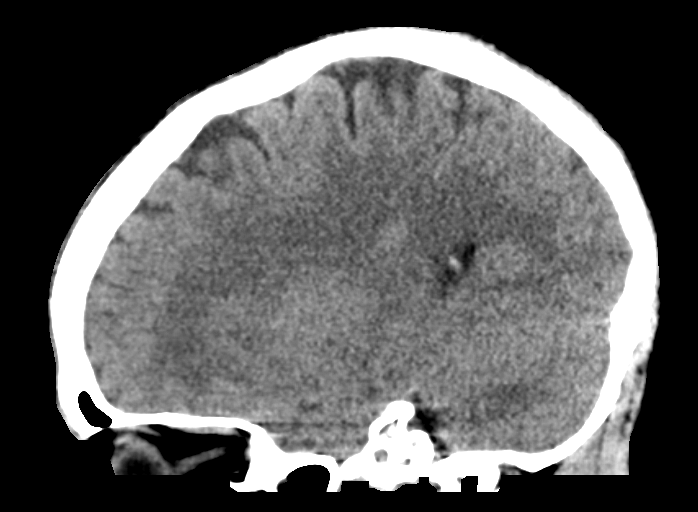

[15 of 47 positions shown; findings below may reference images not displayed]

FINDINGS: Brain: No evidence of acute infarction, hemorrhage, hydrocephalus,
extra-axial collection or mass lesion/mass effect.

Vascular: No hyperdense vessel or unexpected calcification.

Skull: Normal. Negative for fracture or focal lesion.

Sinuses/Orbits: Right sphenoid sinus mucous retention cyst.
Otherwise negative.

Other: None.
IMPRESSION: No acute intracranial process identified.  Normal CT of the head.

By: Lorenz Jumper M.D.

## 2017-05-12 IMAGING — DX DG CHEST 1V PORT
1 series · 1 of 1 positions shown · non-contrast
Comparison: Radiographs August 09, 2016.

CLINICAL DATA: Fever.

EXAM:
PORTABLE CHEST 1 VIEW

[chest ap]
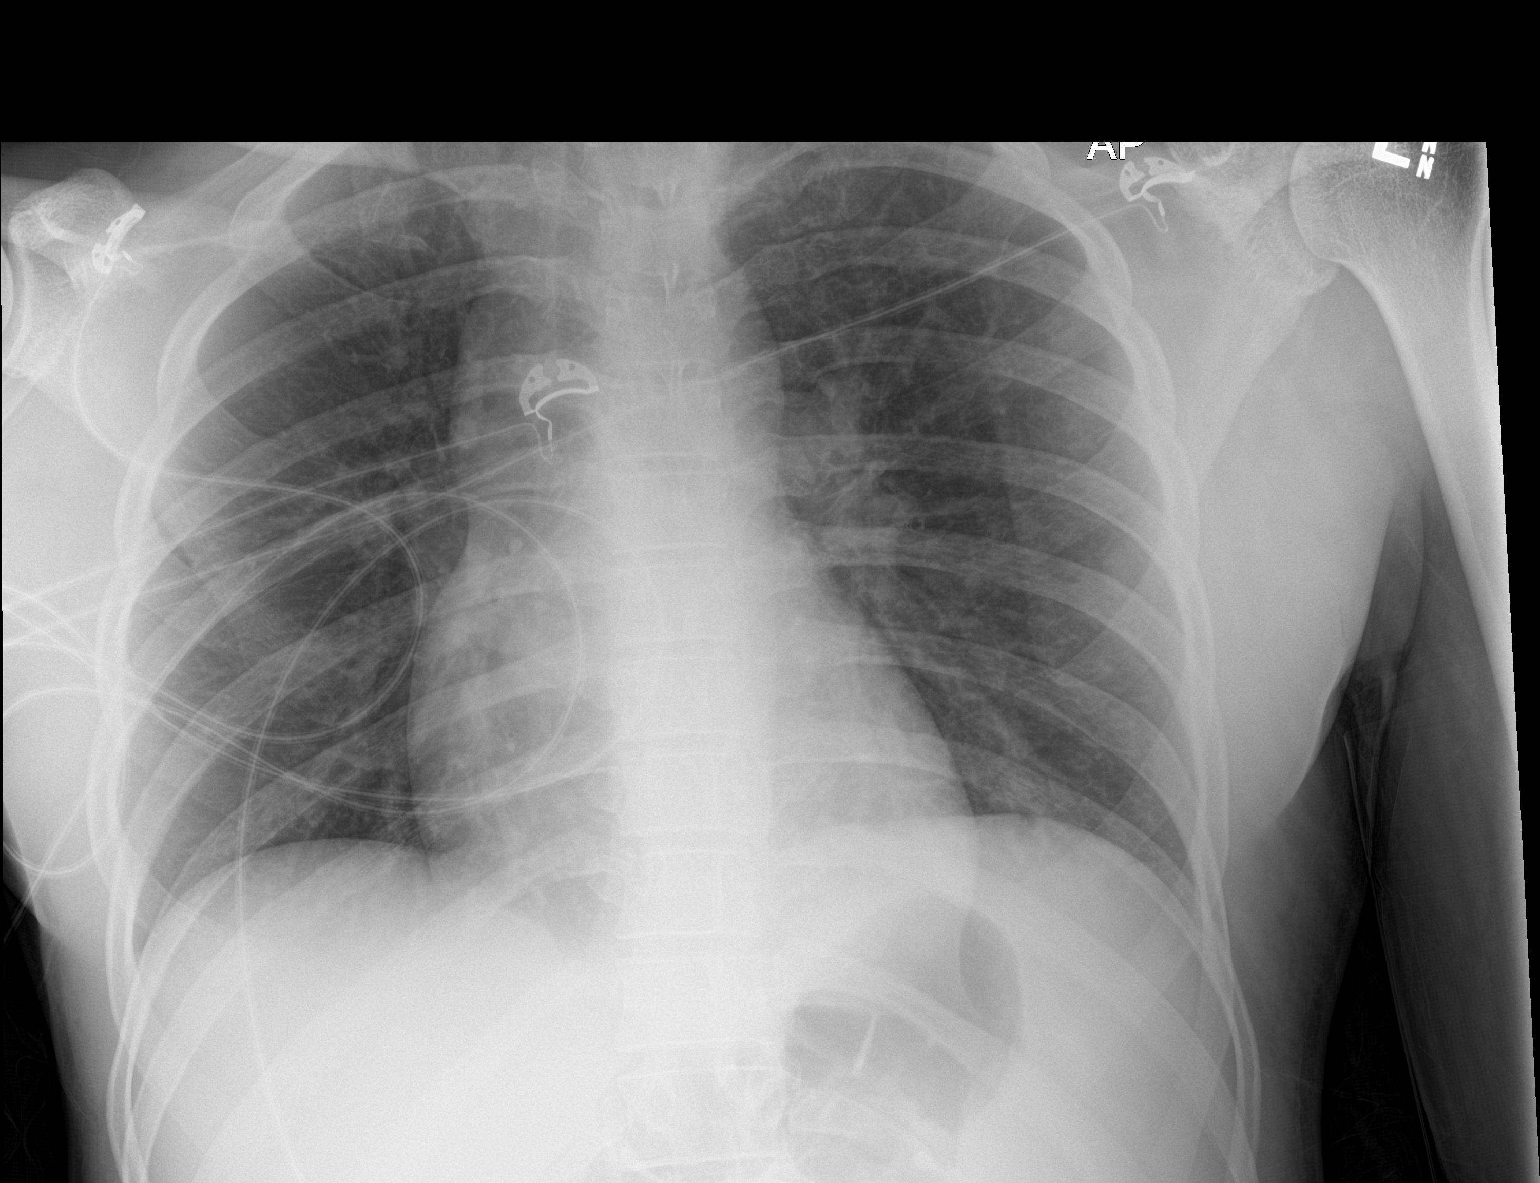

[1 of 1 positions shown; findings below may reference images not displayed]

FINDINGS: The heart size and mediastinal contours are within normal limits.
Both lungs are clear. No pneumothorax or pleural effusion is noted.
The visualized skeletal structures are unremarkable.
IMPRESSION: No acute cardiopulmonary abnormality seen.

## 2017-05-13 IMAGING — DX DG CHEST 2V
2 series · 2 of 2 positions shown · non-contrast
Comparison: 08/30/2016

CLINICAL DATA: Sickle cell anemia with crisis

EXAM:
CHEST  2 VIEW

[chest pa]
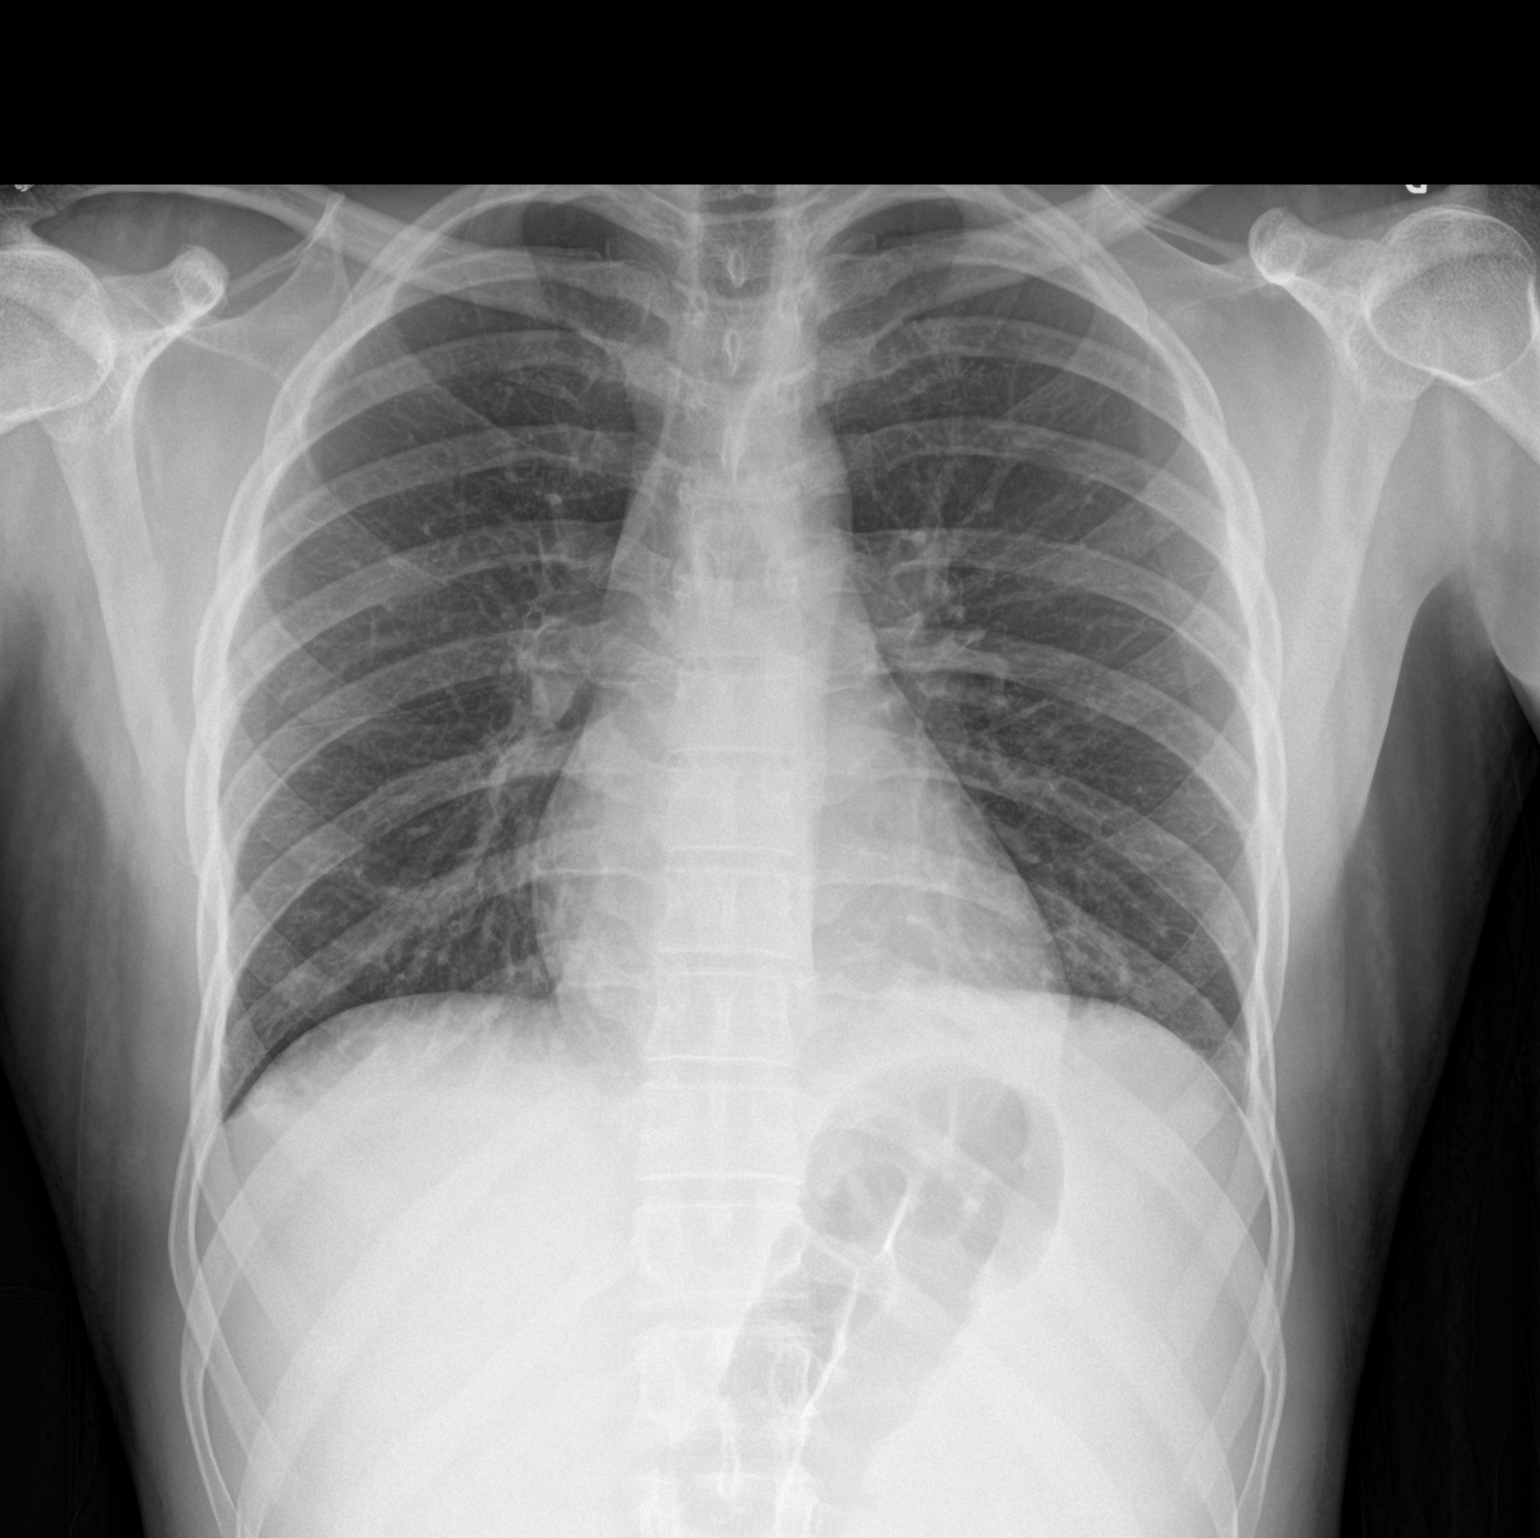

[chest lat]
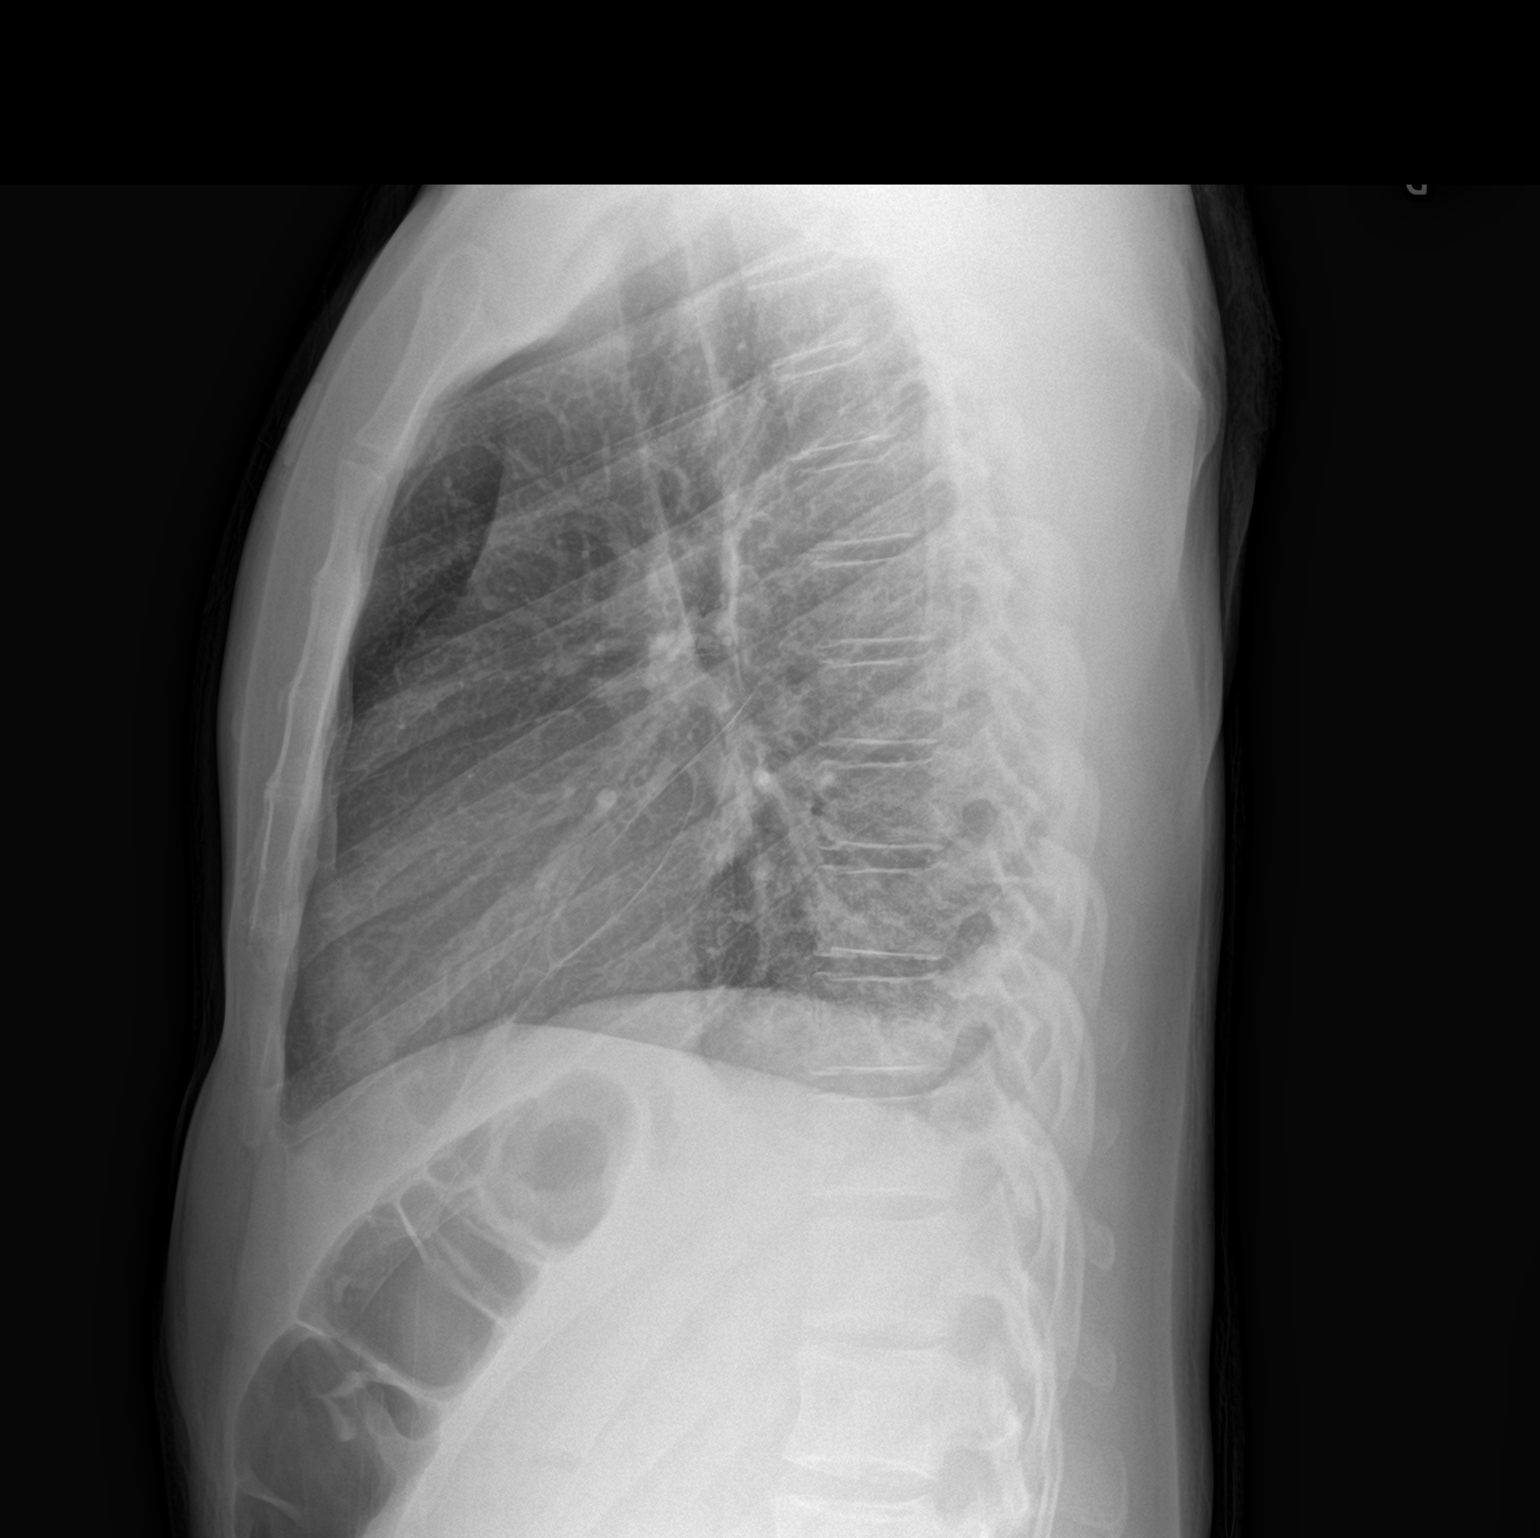

[2 of 2 positions shown; findings below may reference images not displayed]

FINDINGS: The heart size and mediastinal contours are within normal limits.
Both lungs are clear. The visualized skeletal structures are
unremarkable.
IMPRESSION: No active cardiopulmonary disease.
